# Patient Record
Sex: Female | Born: 2000 | Race: Black or African American | Hispanic: No | Marital: Single | Smoking: Former smoker
Health system: Southern US, Community
[De-identification: ages and names within clinical notes are randomized; demographics above are authoritative.]

## PROBLEM LIST (undated history)

## (undated) DIAGNOSIS — D649 Anemia, unspecified: Secondary | ICD-10-CM

## (undated) DIAGNOSIS — K08409 Partial loss of teeth, unspecified cause, unspecified class: Secondary | ICD-10-CM

## (undated) DIAGNOSIS — D509 Iron deficiency anemia, unspecified: Secondary | ICD-10-CM

## (undated) HISTORY — DX: Iron deficiency anemia, unspecified: D50.9

## (undated) HISTORY — PX: GALLBLADDER SURGERY: SHX652

## (undated) HISTORY — DX: Anemia, unspecified: D64.9

## (undated) HISTORY — DX: Partial loss of teeth, unspecified cause, unspecified class: K08.409

---
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
## 2019-04-25 ENCOUNTER — Emergency Department
Admission: EM | Admit: 2019-04-25 | Discharge: 2019-04-25 | Disposition: A | Discharge status: Home / Self Care | Payer: Self-pay | Attending: Emergency Medicine | Admitting: Emergency Medicine

## 2019-04-25 ENCOUNTER — Other Ambulatory Visit: Payer: Self-pay

## 2019-04-25 DIAGNOSIS — N6459 Other signs and symptoms in breast: Secondary | ICD-10-CM | POA: Insufficient documentation

## 2019-04-25 DIAGNOSIS — R21 Rash and other nonspecific skin eruption: Secondary | ICD-10-CM | POA: Insufficient documentation

## 2019-04-25 LAB — POCT PREGNANCY, URINE: Preg Test, Ur: NEGATIVE

## 2019-04-25 NOTE — ED Notes (Signed)
Pt states right nipple started bleeding today. This RN noted small pinpoint drop of blood on nipple.  Also pt is worried that she might be having reaction on skin on arms from baby powder.

## 2019-04-25 NOTE — Discharge Instructions (Signed)
Please call the [facility] in the morning for a follow-up appointment.  You will need to follow-up with them for further tests to evaluate your nipple bleeding.  Please return to the emergency department for any increase in bleeding.

## 2019-04-25 NOTE — ED Triage Notes (Signed)
Reports rash to bilateral arms and neck, states it is from baby powder. Small amount of blood from right nipple today, no injury and no pain. Pt alert and oriented X4, cooperative, RR even and unlabored, color WNL. Pt in NAD.

## 2019-04-25 NOTE — ED Provider Notes (Signed)
[facility] Emergency Department Provider Note  ____________________________________________  Time seen: Approximately 4:48 PM  I have reviewed the triage vital signs and the nursing notes.   HISTORY  Chief Complaint Rash and Nipple Problem    HPI Jacqueline Peters is a 18 y.o. female that presents to the emergency department for evaluation of a small amount of bloody drainage from right nipple this morning and rash to bilateral arms and chest for several days.  Patient noticed a small amount of bloody drainage to right brought this morning.  She placed a Band-Aid on nipple this morning and has not changed since.  Patient's breast does not hurt.  No purulent drainage.  No trauma.  No family history of breast cancer.  This has never happened before.  Last menstrual cycle was 9/14.  Rash started after she was using baby powder for a tick talk radio.  Rash seems to be spreading.  It does not itch.  She has not taken any medications for rash.  No new detergents, lotions, body washes, foods.  No fever.   History reviewed. No pertinent past medical history.  Patient Active Problem List   Diagnosis Date Noted  . CHILDHOOD OBESITY 04/23/2010  . EXCESSIVE MENSTRUAL BLEEDING 04/23/2010    History reviewed. No pertinent surgical history.  Prior to Admission medications   Medication Sig Start Date End Date Taking? Authorizing Provider  amoxicillin (AMOXIL) 250 MG capsule Take 3 capsules (750 mg total) by mouth 2 (two) times daily. 750mg  po bid x 10 days qs Patient not taking: Reported on 04/25/2019 08/20/12   Isaac Bliss, MD    Allergies Patient has no known allergies.  No family history on file.  Social History Social History   Tobacco Use  . Smoking status: Never Smoker  . Smokeless tobacco: Never Used  Substance Use Topics  . Alcohol use: Not Currently  . Drug use: Not Currently     Review of Systems  Constitutional: No fever/chills Cardiovascular: No  chest pain. Respiratory: No SOB. Gastrointestinal: No abdominal pain.  No nausea, no vomiting.  Musculoskeletal: Negative for musculoskeletal pain. Skin: Negative for abrasions, lacerations, ecchymosis.  Positive for rash. Neurological: Negative for headaches   ____________________________________________   PHYSICAL EXAM:  VITAL SIGNS: ED Triage Vitals  Enc Vitals Group     BP 04/25/19 1613 (!) 122/99     Pulse Rate 04/25/19 1613 81     Resp 04/25/19 1613 16     Temp 04/25/19 1613 98.4 F (36.9 C)     Temp Source 04/25/19 1613 Oral     SpO2 04/25/19 1613 100 %     Weight 04/25/19 1614 200 lb (90.7 kg)     Height 04/25/19 1614 5\' 5"  (1.651 m)     Head Circumference --      Peak Flow --      Pain Score 04/25/19 1613 0     Pain Loc --      Pain Edu? --      Excl. in [place]? --      Constitutional: Alert and oriented. Well appearing and in no acute distress. Eyes: Conjunctivae are normal. PERRL. EOMI. Head: Atraumatic. ENT:      Ears:      Nose: No congestion/rhinnorhea.      Mouth/Throat: Mucous membranes are moist.  Neck: No stridor.   Cardiovascular: Normal rate, regular rhythm.  Good peripheral circulation. Breast: Pin point drop of blood to bandage on right nipple.  No masses palpated.  No  active drainage from nipple. Respiratory: Normal respiratory effort without tachypnea or retractions. Lungs CTAB. Good air entry to the bases with no decreased or absent breath sounds Musculoskeletal: Full range of motion to all extremities. No gross deformities appreciated. Neurologic:  Normal speech and language. No gross focal neurologic deficits are appreciated.  Skin:  Skin is warm, dry and intact.  Few flesh colored papules to bilateral upper arms and chest. Psychiatric: Mood and affect are normal. Speech and behavior are normal. Patient exhibits appropriate insight and judgement.   ____________________________________________   LABS (all labs ordered are listed, but only  abnormal results are displayed)  Labs Reviewed  POC URINE PREG, ED  POCT PREGNANCY, URINE   ____________________________________________  EKG   ____________________________________________  RADIOLOGY   No results found.  ____________________________________________    PROCEDURES  Procedure(s) performed:    Procedures    Medications - No data to display   ____________________________________________   INITIAL IMPRESSION / ASSESSMENT AND PLAN / ED COURSE  Pertinent labs & imaging results that were available during my care of the patient were reviewed by me and considered in my medical decision making (see chart for details).  Review of the [place] CSRS was performed in accordance of the [place] prior to dispensing any controlled drugs.   Patient presents emergency department for evaluation of a small amount of bloody drainage to right nipple this morning and rash to arms and chest for several days.  Vital signs and exam are reassuring.  Breast exam is overall unremarkable.  No signs of infection.  Patient will follow-up with the [facility] breast center for further evaluation.  Patient was given prednisone for rash.  Patient will be discharged home with prescriptions for prednisone. Patient is to follow up with primary care and breast center as directed. Patient is given ED precautions to return to the ED for any worsening or new symptoms.  Jacqueline Peters was evaluated in Emergency Department on 04/25/2019 for the symptoms described in the history of present illness. She was evaluated in the context of the global COVID-19 pandemic, which necessitated consideration that the patient might be at risk for infection with the SARS-CoV-2 virus that causes COVID-19. Institutional protocols and algorithms that pertain to the evaluation of patients at risk for COVID-19 are in a state of rapid change based on information released by regulatory bodies including the CDC and federal and state  organizations. These policies and algorithms were followed during the patient's care in the ED.   ____________________________________________  FINAL CLINICAL IMPRESSION(S) / ED DIAGNOSES  Final diagnoses:  Rash  Bleeding from nipple in female      NEW MEDICATIONS STARTED DURING THIS VISIT:  ED Discharge Orders    None          This chart was dictated using voice recognition software/Dragon. Despite best efforts to proofread, errors can occur which can change the meaning. Any change was purely unintentional.    Laban Emperor, PA-C 04/25/19 1904    Duffy Bruce, MD 04/25/19 1911

## 2019-06-25 ENCOUNTER — Ambulatory Visit: Payer: Self-pay

## 2019-06-27 ENCOUNTER — Encounter: Payer: Self-pay | Admitting: *Deleted

## 2019-06-27 ENCOUNTER — Emergency Department: Payer: Self-pay

## 2019-06-27 ENCOUNTER — Emergency Department
Admission: EM | Admit: 2019-06-27 | Discharge: 2019-06-27 | Disposition: A | Discharge status: Home / Self Care | Payer: Self-pay | Attending: Emergency Medicine | Admitting: Emergency Medicine

## 2019-06-27 ENCOUNTER — Other Ambulatory Visit: Payer: Self-pay

## 2019-06-27 DIAGNOSIS — R040 Epistaxis: Secondary | ICD-10-CM | POA: Insufficient documentation

## 2019-06-27 DIAGNOSIS — D473 Essential (hemorrhagic) thrombocythemia: Secondary | ICD-10-CM | POA: Insufficient documentation

## 2019-06-27 DIAGNOSIS — D75839 Thrombocytosis, unspecified: Secondary | ICD-10-CM

## 2019-06-27 DIAGNOSIS — R0781 Pleurodynia: Secondary | ICD-10-CM | POA: Insufficient documentation

## 2019-06-27 DIAGNOSIS — D509 Iron deficiency anemia, unspecified: Secondary | ICD-10-CM | POA: Insufficient documentation

## 2019-06-27 LAB — COMPREHENSIVE METABOLIC PANEL
ALT: 14 U/L (ref 0–44)
AST: 13 U/L — ABNORMAL LOW (ref 15–41)
Albumin: 3.5 g/dL (ref 3.5–5.0)
Alkaline Phosphatase: 44 U/L (ref 38–126)
Anion gap: 11 (ref 5–15)
BUN: 12 mg/dL (ref 6–20)
CO2: 24 mmol/L (ref 22–32)
Calcium: 9.2 mg/dL (ref 8.9–10.3)
Chloride: 102 mmol/L (ref 98–111)
Creatinine, Ser: 0.69 mg/dL (ref 0.44–1.00)
GFR calc Af Amer: >60 mL/min (ref >60)
GFR calc non Af Amer: >60 mL/min (ref >60)
Glucose, Bld: 107 mg/dL — ABNORMAL HIGH (ref 70–99)
Potassium: 3.7 mmol/L (ref 3.5–5.1)
Sodium: 137 mmol/L (ref 135–145)
Total Bilirubin: 0.3 mg/dL (ref 0.3–1.2)
Total Protein: 8.2 g/dL — ABNORMAL HIGH (ref 6.5–8.1)

## 2019-06-27 LAB — CBC
HCT: 25.4 % — ABNORMAL LOW (ref 36.0–46.0)
Hemoglobin: 7.5 g/dL — ABNORMAL LOW (ref 12.0–15.0)
MCH: 19.6 pg — ABNORMAL LOW (ref 26.0–34.0)
MCHC: 29.5 g/dL — ABNORMAL LOW (ref 30.0–36.0)
MCV: 66.3 fL — ABNORMAL LOW (ref 80.0–100.0)
Platelets: 979 10*3/uL (ref 150–400)
RBC: 3.83 MIL/uL — ABNORMAL LOW (ref 3.87–5.11)
RDW: 18.5 % — ABNORMAL HIGH (ref 11.5–15.5)
WBC: 11.9 10*3/uL — ABNORMAL HIGH (ref 4.0–10.5)
nRBC: 0 % (ref 0.0–0.2)

## 2019-06-27 LAB — URINALYSIS, COMPLETE (UACMP) WITH MICROSCOPIC
Bacteria, UA: NONE SEEN
Bilirubin Urine: NEGATIVE
Glucose, UA: NEGATIVE mg/dL
Hgb urine dipstick: NEGATIVE
Ketones, ur: NEGATIVE mg/dL
Nitrite: NEGATIVE
Protein, ur: NEGATIVE mg/dL
Specific Gravity, Urine: 1.029 (ref 1.005–1.030)
pH: 6 (ref 5.0–8.0)

## 2019-06-27 LAB — IRON AND TIBC
Iron: 10 ug/dL — ABNORMAL LOW (ref 28–170)
Saturation Ratios: 3 % — ABNORMAL LOW (ref 10.4–31.8)
TIBC: 372 ug/dL (ref 250–450)
UIBC: 362 ug/dL

## 2019-06-27 LAB — POCT PREGNANCY, URINE: Preg Test, Ur: NEGATIVE

## 2019-06-27 LAB — LIPASE, BLOOD: Lipase: 23 U/L (ref 11–51)

## 2019-06-27 LAB — FERRITIN: Ferritin: 5 ng/mL — ABNORMAL LOW (ref 11–307)

## 2019-06-27 MED ORDER — FERROUS SULFATE 325 (65 FE) MG PO TABS: 325.0000 mg | ORAL_TABLET | Freq: Every day | ORAL | 0 refills | Status: DC

## 2019-06-27 MED ORDER — SODIUM CHLORIDE 0.9% FLUSH: 3.0000 mL | Freq: Once | INTRAVENOUS | Status: DC

## 2019-06-27 MED ORDER — IBUPROFEN 600 MG PO TABS: 600.0000 mg | ORAL_TABLET | Freq: Four times a day (QID) | ORAL | 0 refills | Status: DC | PRN

## 2019-06-27 MED ORDER — IBUPROFEN 600 MG PO TABS
600.0000 mg | ORAL_TABLET | Freq: Once | ORAL | Status: AC
  Administered 2019-06-27: 600 mg via ORAL
  Filled 2019-06-27: qty 1

## 2019-06-27 NOTE — ED Notes (Signed)
poct pregnancy Negative 

## 2019-06-27 NOTE — ED Notes (Signed)
Lab notified to add on Iron and Ferritin levels.

## 2019-06-27 NOTE — Discharge Instructions (Addendum)
Dr. Kem Parkinson office has been given your information and should call you tomorrow morning.  If you do not hear from her by tomorrow afternoon, you should call to make an appointment as soon as possible.  Take the iron pill as prescribed until you follow-up with Dr. Mike Gip.  Return to the ER for any new or worsening pain, difficulty breathing, abnormal bleeding or bruising, weakness, or any other new or worsening symptoms that concern you.

## 2019-06-27 NOTE — ED Notes (Signed)
Patient transported to X-ray 

## 2019-06-27 NOTE — ED Provider Notes (Signed)
[facility] Emergency Department Provider Note ____________________________________________   First MD Initiated Contact with Patient 06/27/19 1618     (approximate)  I have reviewed the triage vital signs and the nursing notes.   HISTORY  Chief Complaint Abdominal Pain and Epistaxis    HPI Jacqueline Peters is a 18 y.o. female with PMH as noted below and no active medical problems who presents with right flank/lower rib area pain, acute onset since yesterday, and not precipitated by any trauma.  She states it is worse with certain positions.  She has no associated shortness of breath, nausea or vomiting, abdominal pain, or fever.  She reports mild discomfort when she urinates.  She denies any vaginal bleeding or discharge.  She does report that she just ended her period, and her periods are usually heavy.  She had a nosebleed earlier that resolved.  She denies any weakness or fatigue, or any abnormal bleeding or bruising.  No past medical history on file.  Patient Active Problem List   Diagnosis Date Noted  . CHILDHOOD OBESITY 04/23/2010  . EXCESSIVE MENSTRUAL BLEEDING 04/23/2010    No past surgical history on file.  Prior to Admission medications   Medication Sig Start Date End Date Taking? Authorizing Provider  amoxicillin (AMOXIL) 250 MG capsule Take 3 capsules (750 mg total) by mouth 2 (two) times daily. 750mg  po bid x 10 days qs Patient not taking: Reported on 04/25/2019 08/20/12   Isaac Bliss, MD  ferrous sulfate 325 (65 FE) MG tablet Take 1 tablet (325 mg total) by mouth daily. 06/27/19 07/27/19  Arta Silence, MD  ibuprofen (ADVIL) 600 MG tablet Take 1 tablet (600 mg total) by mouth every 6 (six) hours as needed. 06/27/19   Arta Silence, MD    Allergies Patient has no known allergies.  No family history on file.  Social History Social History   Tobacco Use  . Smoking status: Never Smoker  . Smokeless tobacco: Never Used   Substance Use Topics  . Alcohol use: Not Currently  . Drug use: Not Currently    Review of Systems  Constitutional: No fever/chills.  No fatigue. Eyes: No visual changes. ENT: No sore throat. Cardiovascular: Denies chest pain. Respiratory: Denies shortness of breath. Gastrointestinal: No nausea, no vomiting.  No diarrhea.  Genitourinary: Negative for vaginal bleeding. Musculoskeletal: Negative for back pain. Skin: Negative for rash. Neurological: Negative for headaches, focal weakness or numbness.   ____________________________________________   PHYSICAL EXAM:  VITAL SIGNS: ED Triage Vitals  Enc Vitals Group     BP 06/27/19 1523 122/69     Pulse Rate 06/27/19 1523 86     Resp 06/27/19 1523 20     Temp 06/27/19 1523 98.6 F (37 C)     Temp Source 06/27/19 1523 Oral     SpO2 06/27/19 1523 100 %     Weight 06/27/19 1524 190 lb (86.2 kg)     Height 06/27/19 1524 5\' 5"  (1.651 m)     Head Circumference --      Peak Flow --      Pain Score 06/27/19 1523 10     Pain Loc --      Pain Edu? --      Excl. in [place]? --     Constitutional: Alert and oriented. Well appearing and in no acute distress. Eyes: Conjunctivae are normal.  Head: Atraumatic. Nose: No congestion/rhinnorhea. Mouth/Throat: Mucous membranes are moist.   Neck: Normal range of motion.  Cardiovascular: Normal rate, regular  rhythm.  Good peripheral circulation. Respiratory: Normal respiratory effort.  No retractions.  Very mild right lateral inferior rib area tenderness.  No step-off or crepitus. Gastrointestinal: Soft and nontender. No distention.  Genitourinary: No flank or CVA tenderness. Musculoskeletal: No lower extremity edema.  Extremities warm and well perfused.  Neurologic:  Normal speech and language. No gross focal neurologic deficits are appreciated.  Skin:  Skin is warm and dry. No rash noted. Psychiatric: Mood and affect are normal. Speech and behavior are normal.   ____________________________________________   LABS (all labs ordered are listed, but only abnormal results are displayed)  Labs Reviewed  COMPREHENSIVE METABOLIC PANEL - Abnormal; Notable for the following components:      Result Value   Glucose, Bld 107 (*)    Total Protein 8.2 (*)    AST 13 (*)    All other components within normal limits  CBC - Abnormal; Notable for the following components:   WBC 11.9 (*)    RBC 3.83 (*)    Hemoglobin 7.5 (*)    HCT 25.4 (*)    MCV 66.3 (*)    MCH 19.6 (*)    MCHC 29.5 (*)    RDW 18.5 (*)    Platelets 979 (*)    All other components within normal limits  URINALYSIS, COMPLETE (UACMP) WITH MICROSCOPIC - Abnormal; Notable for the following components:   Color, Urine YELLOW (*)    APPearance HAZY (*)    Leukocytes,Ua SMALL (*)    All other components within normal limits  FERRITIN - Abnormal; Notable for the following components:   Ferritin 5 (*)    All other components within normal limits  IRON AND TIBC - Abnormal; Notable for the following components:   Iron 10 (*)    Saturation Ratios 3 (*)    All other components within normal limits  LIPASE, BLOOD  PATHOLOGIST SMEAR REVIEW  POC URINE PREG, ED  POCT PREGNANCY, URINE   ____________________________________________  EKG   ____________________________________________  RADIOLOGY  XR R ribs and chest: No acute abnormality  ____________________________________________   PROCEDURES  Procedure(s) performed: No  Procedures  Critical Care performed: No ____________________________________________   INITIAL IMPRESSION / ASSESSMENT AND PLAN / ED COURSE  Pertinent labs & imaging results that were available during my care of the patient were reviewed by me and considered in my medical decision making (see chart for details).  18 year old female with no known active medical problems presents with right lateral rib/flank pain since yesterday.  She has no significant  associated symptoms.  She did have a nosebleed today that resolved on its own.  She has had no other abnormal bleeding or bruising and no fatigue.  On exam, the patient is very well-appearing.  Her vital signs are normal.  The physical exam is unremarkable except for mild discomfort when I palpate the right lateral lower rib area.  There is no abdominal or CVA tenderness.  Overall presentation is most consistent with musculoskeletal pain.  There is no evidence of cholecystitis, other hepatobiliary cause, or other intra-abdominal source.  The patient has some mild discomfort when she urinates but no symptoms of UTI or pyelonephritis.  She has no respiratory symptoms or tachycardia.  There is no evidence of PE or other pulmonary etiology.  Of note, initial labs reveal significant iron deficiency anemia and thrombocytosis.  The patient endorses heavy periods chronically, and I suspect that she has been somewhat anemic chronically.  She has no symptoms of anemia or abnormal vital  signs and there is no indication for emergent transfusion.  However, besides iron deficiency, the differential for the thrombocytosis includes mild dysplastic syndromes or leukemia.  I consulted Dr. Mike Gip from hematology.  She recommends if the patient has no indication for emergent transfusion, that we start the patient on iron supplementation and have her follow-up in the hematology office this week.  In the meantime we will obtain x-rays of the right ribs and chest.  ----------------------------------------- 6:11 PM on 06/27/2019 -----------------------------------------  X-rays show no acute abnormality.  The patient continues to appear comfortable.  She is stable for discharge home.  She will follow up with Dr. Mike Gip tomorrow.  Dr. Mike Gip has been provided with the patient's information.  I gave the patient return precautions and she expressed understanding.  I prescribed ferrous sulfate.   ____________________________________________   FINAL CLINICAL IMPRESSION(S) / ED DIAGNOSES  Final diagnoses:  Rib pain on right side  Iron deficiency anemia, unspecified iron deficiency anemia type  Thrombocytosis (HCC)      NEW MEDICATIONS STARTED DURING THIS VISIT:  New Prescriptions   FERROUS SULFATE 325 (65 FE) MG TABLET    Take 1 tablet (325 mg total) by mouth daily.   IBUPROFEN (ADVIL) 600 MG TABLET    Take 1 tablet (600 mg total) by mouth every 6 (six) hours as needed.     Note:  This document was prepared using Dragon voice recognition software and may include unintentional dictation errors.   Arta Silence, MD 06/27/19 [phone]

## 2019-06-27 NOTE — ED Triage Notes (Signed)
Pt has right side abd pain.  No n/v/d.  Pt also has right side nose bleed.  Bleeding stopped in triage.  No known injury to nose.  Pt alert  Speech clear.

## 2019-06-28 LAB — PATHOLOGIST SMEAR REVIEW

## 2019-06-30 NOTE — Progress Notes (Signed)
[facility]  [address] [place], [place] [address] Phone: [phone]  Fax: [phone]   Clinic Day:  07/01/2019  Referring physician: Arta Silence, MD  Chief Complaint: Jacqueline Peters is a 18 y.o. female with iron deficiency anemia and thrombocytosis who is referred in consultation by Dr. Arta Peters for assessment and management.   HPI:  The patient tested positive for COVID-19 on 01/29/2019.   The patient was seen on 05/03/2019 with bloody right nipple discharge x 2 days. Right breast ultrasound on 05/03/2019 revealed right retroareolar anterior depth corresponding to the site of blood noted on the patient's nipple (1-2 o'clock) was negative. There was no ductal dilation. There was no intraductal mass. There was no suspicious mass or associated feature. Clinical follow up was recommended.   She was seen in the [facility] ER on 06/27/2019 by Dr. Cherylann Peters for right sided chest pain and epistaxis. She noted right flank and lower rib pain.  Pain increased with position change. Pain had a pleuritic component.  She reported mild discomfort when she urinated. She had just ended her period. She had a nosebleed earlier that resolved.  She denied any shortness of breath, nausea or vomiting, abdominal pain, or fever. She denied any vaginal bleeding or discharge. She denied any weakness or fatigue, or any abnormal bleeding or bruising.  Right ribs and chest x-ray on 06/27/2019 was normal.   Labs on 06/27/2019 revealed a hematocrit 25.4, hemoglobin 7.5, MCV 66.3, platelets 979,000, and WBC 11,900. Ferritin was 5 with and iron saturation of 3% with a TIBC of 372. Total protein was 8.2.  Lipase was 23. Pregnancy test was negative.  Urinalysis revealed a small amount of leukocytes.   Peripheral smear revealed a microcytic hypochromic iron deficiency anemia. There was mild leukocytosis with unremarkable WBC morphology. There was thrombocytosis with unremarkable  platelet morphology. Findings consistent with chronic iron deficiency and secondary reactive thrombocytosis.   Last available CBC on 04/23/2010 revealed a hematocrit of 33.8, hemoglobin 11.4, MCV 83.1, platelets 394,000, WBC 5900 with an ANC of 2200.  Symptomatically, she feels "good".  She describes right rib pain for 1 week. Pain increases with deep breathing, coughing, and any positional changes.  She denies any trauma.  She denies any lower extremity swelling.  She denies any fever.  She has a history of nosebleeds since she was younger; her last nosebleed was 1 day ago.  Patient reports intermittent hematuria. She denies any melena or hematochezia.  Bowels are normal. She denies any B symptoms.  She denies any dizzy or lightheadedness.   Her diet is described as poor. She eats a lot of fast food. Patient eats meat regularly. She does not eat vegetables. She denies any ice pica. She has no restless legs. The patient has been on oral iron once a day since 06/27/2019.  She takes oral iron with water.   Menses is described as heavy.  With each cycle, she goes through 16 pads.  Her previous menstrual cycle was 06/07/2019 - 06/11/2019.  Her cycle is every 4 weeks. She is not on any birth control.  Menstrual cramps are mild; she only takes ibuprofen on the first day. She denies easy bruising or bleeding.  She has never had surgery or a dental extraction.   Past Medical History:  Diagnosis Date  . Anemia   She denies any family history of any blood disorders.  Her maternal grandmother has thyroid issues and was anemic. Cancer runs in her family. Her maternal  great aunt died from breast cancer. Her paternal great grandmother passed from breast cancer. Her maternal grandfather had leukemia.   History reviewed. No pertinent surgical history.  History reviewed. No pertinent family history.  Social History:  reports that she has never smoked. She has never used smokeless tobacco. She reports previous  alcohol use. She reports previous drug use. The patient smokes black cigars twice a day for the past 3 months.  She lives at home with her mother.  Her mother works 2nd shift.  She does not work.  She is not in school.  She lives in [place].  The patient is accompanied by her mother, Jacqueline Peters, via face time today.  Allergies: No Known Allergies  Current Medications: Current Outpatient Medications  Medication Sig Dispense Refill  . ferrous sulfate 325 (65 FE) MG tablet Take 1 tablet (325 mg total) by mouth daily. 30 tablet 0  . amoxicillin (AMOXIL) 250 MG capsule Take 3 capsules (750 mg total) by mouth 2 (two) times daily. 750mg  po bid x 10 days qs (Patient not taking: Reported on 04/25/2019) 60 capsule 0  . ibuprofen (ADVIL) 600 MG tablet Take 1 tablet (600 mg total) by mouth every 6 (six) hours as needed. (Patient not taking: Reported on 07/01/2019) 20 tablet 0   No current facility-administered medications for this visit.     Review of Systems  Constitutional: Negative.  Negative for chills, diaphoresis, fever, malaise/fatigue and weight loss.       Feels "good".  Energy level is good.  HENT: Positive for nosebleeds. Negative for congestion, ear discharge, ear pain, hearing loss, sinus pain and sore throat.   Eyes: Negative.  Negative for blurred vision, double vision, photophobia and pain.  Respiratory: Positive for cough. Negative for hemoptysis, sputum production, shortness of breath and wheezing.   Cardiovascular: Positive for chest pain (right lower lateral chest pain; pleuritic in nature; pain increased with position change). Negative for palpitations, orthopnea, leg swelling and PND.  Gastrointestinal: Negative for abdominal pain, blood in stool, constipation, diarrhea, heartburn, melena, nausea and vomiting.       Normal bowels. Poor diet.  No ice pica.  Genitourinary: Positive for hematuria (intermittent). Negative for dysuria, frequency and urgency.       Heavy menses (use 16  pads per cycle).  Musculoskeletal: Negative.  Negative for back pain, joint pain and myalgias.  Skin: Negative.  Negative for itching and rash.  Neurological: Negative.  Negative for dizziness, tingling, sensory change, speech change, focal weakness, weakness and headaches.  Endo/Heme/Allergies: Negative.  Does not bruise/bleed easily.  Psychiatric/Behavioral: Negative.  Negative for depression and memory loss. The patient is not nervous/anxious and does not have insomnia.   All other systems reviewed and are negative.  Performance status (ECOG): 1  Vitals Blood pressure 122/71, pulse 77, temperature 97.9 F (36.6 C), temperature source Tympanic, resp. rate 18, height 5\' 5"  (1.651 m), weight 190 lb 4.1 oz (86.3 kg), last menstrual period 06/11/2019, SpO2 100 %.  Physical Exam  Constitutional: She is oriented to person, place, and time. She appears well-developed and well-nourished. No distress.  HENT:  Head: Normocephalic and atraumatic.  Mouth/Throat: Oropharynx is clear and moist. No oropharyngeal exudate.  Black night cap.  Mask.  Eyes: Pupils are equal, round, and reactive to light. Conjunctivae and EOM are normal. No scleral icterus.  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 Brown eyes.  Neck: Normal range of motion. Neck supple. No JVD present.  Cardiovascular: Normal rate, regular rhythm  and normal heart sounds.  No murmur heard. Pulmonary/Chest: Effort normal and breath sounds normal. No respiratory distress. She has no wheezes. She has no rales. She exhibits no tenderness.  Right lower lateral rib pain on palpation (pinpoint).  Abdominal: Soft. Bowel sounds are normal. She exhibits no distension and no mass. There is no abdominal tenderness. There is no rebound and no guarding.  Musculoskeletal: Normal range of motion.        General: No tenderness or edema.     Comments: Negative Homan's sign.  Lymphadenopathy:       Head (right side): No preauricular,  no posterior auricular and no occipital adenopathy present.       Head (left side): No preauricular, no posterior auricular and no occipital adenopathy present.    She has no cervical adenopathy.    She has no axillary adenopathy.       Right: No inguinal and no supraclavicular adenopathy present.       Left: No inguinal and no supraclavicular adenopathy present.  Neurological: She is alert and oriented to person, place, and time.  Skin: Skin is warm and dry. No rash noted. She is not diaphoretic. No erythema. No pallor.  Psychiatric: She has a normal mood and affect. Her behavior is normal. Judgment and thought content normal.  Nursing note and vitals reviewed.   No visits with results within 3 Day(s) from this visit.  Latest known visit with results is:  Admission on 06/27/2019, Discharged on 06/27/2019  Component Date Value Ref Range Status  . Lipase 06/27/2019 23  11 - 51 U/L Final   Performed at [facility], [address] [place], [place] [address]  . Sodium 06/27/2019 137  135 - 145 mmol/L Final  . Potassium 06/27/2019 3.7  3.5 - 5.1 mmol/L Final  . Chloride 06/27/2019 102  98 - 111 mmol/L Final  . CO2 06/27/2019 24  22 - 32 mmol/L Final  . Glucose, Bld 06/27/2019 107* 70 - 99 mg/dL Final  . BUN 06/27/2019 12  6 - 20 mg/dL Final  . Creatinine, Ser 06/27/2019 0.69  0.44 - 1.00 mg/dL Final  . Calcium 06/27/2019 9.2  8.9 - 10.3 mg/dL Final  . Total Protein 06/27/2019 8.2* 6.5 - 8.1 g/dL Final  . Albumin 06/27/2019 3.5  3.5 - 5.0 g/dL Final  . AST 06/27/2019 13* 15 - 41 U/L Final  . ALT 06/27/2019 14  0 - 44 U/L Final  . Alkaline Phosphatase 06/27/2019 44  38 - 126 U/L Final  . Total Bilirubin 06/27/2019 0.3  0.3 - 1.2 mg/dL Final  . GFR calc non Af Amer 06/27/2019 >60  >60 mL/min Final  . GFR calc Af Amer 06/27/2019 >60  >60 mL/min Final  . Anion gap 06/27/2019 11  5 - 15 Final   Performed at [facility], [address], [place], [place] [address]  . WBC  06/27/2019 11.9* 4.0 - 10.5 K/uL Final  . RBC 06/27/2019 3.83* 3.87 - 5.11 MIL/uL Final  . Hemoglobin 06/27/2019 7.5* 12.0 - 15.0 g/dL Final   Comment: Reticulocyte Hemoglobin testing may be clinically indicated, consider ordering this additional test [id number]   . HCT 06/27/2019 25.4* 36.0 - 46.0 % Final  . MCV 06/27/2019 66.3* 80.0 - 100.0 fL Final  . MCH 06/27/2019 19.6* 26.0 - 34.0 pg Final  . MCHC 06/27/2019 29.5* 30.0 - 36.0 g/dL Final  . RDW 06/27/2019 18.5* 11.5 - 15.5 % Final  . Platelets 06/27/2019 979* 150 - 400  K/uL Final   This critical result has verified and been called to [facility] by [facility] on 11 19 2020 at 1633, and has been read back.   Marland Kitchen nRBC 06/27/2019 0.0  0.0 - 0.2 % Final   Performed at [facility], [address] [place], [place] [address]  . Color, Urine 06/27/2019 YELLOW* YELLOW Final  . APPearance 06/27/2019 HAZY* CLEAR Final  . Specific Gravity, Urine 06/27/2019 1.029  1.005 - 1.030 Final  . pH 06/27/2019 6.0  5.0 - 8.0 Final  . Glucose, UA 06/27/2019 NEGATIVE  NEGATIVE mg/dL Final  . Hgb urine dipstick 06/27/2019 NEGATIVE  NEGATIVE Final  . Bilirubin Urine 06/27/2019 NEGATIVE  NEGATIVE Final  . Ketones, ur 06/27/2019 NEGATIVE  NEGATIVE mg/dL Final  . Protein, ur 06/27/2019 NEGATIVE  NEGATIVE mg/dL Final  . Nitrite 06/27/2019 NEGATIVE  NEGATIVE Final  . Chalmers Guest 06/27/2019 SMALL* NEGATIVE Final  . RBC / HPF 06/27/2019 0-5  0 - 5 RBC/hpf Final  . WBC, UA 06/27/2019 0-5  0 - 5 WBC/hpf Final  . Bacteria, UA 06/27/2019 NONE SEEN  NONE SEEN Final  . Squamous Epithelial / LPF 06/27/2019 0-5  0 - 5 Final  . Mucus 06/27/2019 PRESENT   Final   Performed at [facility], [address] [place], [place] [address]  . Preg Test, Ur 06/27/2019 NEGATIVE  NEGATIVE Final   Comment:        THE SENSITIVITY OF THIS METHODOLOGY IS >24 mIU/mL   . Path Review 06/27/2019 Blood smear is reviewed.   Final   Comment: Patient  presented to ED with right side rib pain. Microcytic hypochromic iron deficiency anemia Mild leukocytosis with unremarkable WBC morphology. Thrombocytosis with unremarkable platelet morphology. Findings consistent with chronic iron deficiency and secondary reactive thrombocytosis. Reviewed by Dellia Nims Reuel Derby, M.D. Performed at [facility], [address], [place], [place] [address]   . Ferritin 06/27/2019 5* 11 - 307 ng/mL Final   Performed at [facility], [address] [place], [place] [address]  . Iron 06/27/2019 10* 28 - 170 ug/dL Final  . TIBC 06/27/2019 372  250 - 450 ug/dL Final  . Saturation Ratios 06/27/2019 3* 10.4 - 31.8 % Final  . UIBC 06/27/2019 362  ug/dL Final   Performed at [facility], [address], [place], [place] [address]    Assessment:  Jakyrah Myklebust is a 19 y.o. 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  Diet is poor.  She has menorrhagia.  She notes intermittent hematuria.  She began ferrous sulfate 1 tablet/day on 06/27/2019.  She has a 1 week history of right lower lateral chest pain/rib pain without trauma.  Pain is somewhat pleuritic in nature and is worse with position changes.  She denies any shortness of breath or hemoptysis.  She denies any trauma. Oxygen saturation is 100%.  CXR and rib films on 06/27/2019 were normal.  Labs on 06/27/2019 revealed a hematocrit 25.4, hemoglobin 7.5, MCV 66.3, platelets 979,000, WBC 11,900.  Ferritin was 5 with and iron saturation of 3% with a TIBC of 372.  Total protein was 8.2.  Lipase was 23.   Peripheral smear revealed a microcytic hypochromic iron deficiency anemia. There was mild leukocytosis with unremarkable WBC morphology. There was thrombocytosis with unremarkable platelet morphology. Findings consistent with chronic iron deficiency and secondary reactive thrombocytosis.  She has a 2 day history of bloody right nipple discharge.  Right breast ultrasound  on 05/03/2019 revealed right retroareolar  anterior depth corresponding to the site of blood noted on the patient's nipple (1-2 o'clock) was negative. There was no ductal dilation. There was no intraductal mass. There was no suspicious mass or associated feature. Clinical follow up was recommended.   Symptomatically, she denies any shortness of breath.  She denies any melena or hematochezia.  Menses is heavy.  Plan: 1.   Labs today:  CBC with diff, retic, PT, PTT, von Willebrand panel, D-dimer, hold tube. 2.   Iron deficiency anemia  Hematocrit 25.4.  Hemoglobin 7.5.  MCV 66.3 on 06/27/2019.   Ferritin 5 with an iron saturation of 3% and a TIBC of 372.  Patient notes menorrhagia and intermittent hematuria.  Diet is poor.   Discuss increase in iron rich foods.  Increase oral iron to BID and possibly TID if tolerated.   Take oral iron with OJ or vitamin C to increase absorption.  If oral iron not tolerated, discuss consideration of IV iron.   Preauth Venofer. 3.   Thrombocytosis  Platelet count 979.000 on 06/27/2019.  Etiology is felt reactive and due to severe iron deficiency anemia.  Follow platelet count with treatment of iron deficiency anemia.  If platelet count does not normalize, discuss plan to r/o myeloproliferative disorder. 4.   Right sided rib/chest pain  Etiology unclear.  Patient denies trauma.  Patient denies any lower extremity edema   Pain appears to localize to a lower lateral rib.  Patient has a history of COVID-19.  Discuss checking D-dimer to r/o PE. 5.   Menorrhagia  Patient notes minimal use of ibuprofen and no use of aspirin.  Bleeding diathesis evaluation:  PT, PTT, von Willebrand panel. 6.   RTC in 1 week for MD assessment, labs (CBC, retic), and +/- Venofer.  Addendum:  D-dimer was 7401.92 (0-499).  Patient contacted at home.  Results reviewed and directed to the [facility] ER.  The charge nurse at [facility] was contacted.  I discussed the assessment and treatment plan  with the patient.  The patient was provided an opportunity to ask questions and all were answered.  The patient agreed with the plan and demonstrated an understanding of the instructions.  The patient was advised to call back if the symptoms worsen or if the condition fails to improve as anticipated.  I provided 45 minutes of face-to-face time during this this encounter and > 50% was spent counseling as documented under my assessment and plan.    Melissa C. Mike Gip, MD, PhD    07/01/2019, 10:52 AM  I, Selena Batten, am acting as scribe for [place]. Mike Gip, MD, PhD.  I, Melissa C. Mike Gip, MD, have reviewed the above documentation for accuracy and completeness, and I agree with the above.

## 2019-07-01 ENCOUNTER — Telehealth: Payer: Self-pay | Admitting: *Deleted

## 2019-07-01 ENCOUNTER — Inpatient Hospital Stay: Payer: Self-pay | Attending: Hematology and Oncology | Admitting: Hematology and Oncology

## 2019-07-01 ENCOUNTER — Telehealth: Payer: Self-pay | Admitting: Hematology and Oncology

## 2019-07-01 ENCOUNTER — Encounter: Payer: Self-pay | Admitting: Hematology and Oncology

## 2019-07-01 ENCOUNTER — Telehealth: Payer: Self-pay

## 2019-07-01 ENCOUNTER — Inpatient Hospital Stay: Payer: Self-pay

## 2019-07-01 ENCOUNTER — Other Ambulatory Visit: Payer: Self-pay

## 2019-07-01 ENCOUNTER — Encounter: Payer: Self-pay | Admitting: *Deleted

## 2019-07-01 ENCOUNTER — Emergency Department: Payer: Self-pay

## 2019-07-01 ENCOUNTER — Emergency Department
Admission: EM | Admit: 2019-07-01 | Discharge: 2019-07-01 | Disposition: A | Discharge status: Home / Self Care | Payer: Self-pay | Attending: Emergency Medicine | Admitting: Emergency Medicine

## 2019-07-01 VITALS — BP 122/71 | HR 77 | Temp 97.9°F | Resp 18 | Ht 65.0 in | Wt 190.3 lb

## 2019-07-01 DIAGNOSIS — R0781 Pleurodynia: Secondary | ICD-10-CM | POA: Insufficient documentation

## 2019-07-01 DIAGNOSIS — E041 Nontoxic single thyroid nodule: Secondary | ICD-10-CM | POA: Insufficient documentation

## 2019-07-01 DIAGNOSIS — Z79899 Other long term (current) drug therapy: Secondary | ICD-10-CM | POA: Insufficient documentation

## 2019-07-01 DIAGNOSIS — R319 Hematuria, unspecified: Secondary | ICD-10-CM | POA: Insufficient documentation

## 2019-07-01 DIAGNOSIS — N920 Excessive and frequent menstruation with regular cycle: Secondary | ICD-10-CM | POA: Insufficient documentation

## 2019-07-01 DIAGNOSIS — D75839 Thrombocytosis, unspecified: Secondary | ICD-10-CM

## 2019-07-01 DIAGNOSIS — D500 Iron deficiency anemia secondary to blood loss (chronic): Secondary | ICD-10-CM | POA: Insufficient documentation

## 2019-07-01 DIAGNOSIS — D509 Iron deficiency anemia, unspecified: Secondary | ICD-10-CM

## 2019-07-01 DIAGNOSIS — R7989 Other specified abnormal findings of blood chemistry: Secondary | ICD-10-CM

## 2019-07-01 DIAGNOSIS — R0789 Other chest pain: Secondary | ICD-10-CM | POA: Insufficient documentation

## 2019-07-01 DIAGNOSIS — D473 Essential (hemorrhagic) thrombocythemia: Secondary | ICD-10-CM | POA: Insufficient documentation

## 2019-07-01 DIAGNOSIS — D649 Anemia, unspecified: Secondary | ICD-10-CM | POA: Insufficient documentation

## 2019-07-01 DIAGNOSIS — R791 Abnormal coagulation profile: Secondary | ICD-10-CM | POA: Insufficient documentation

## 2019-07-01 LAB — CBC WITH DIFFERENTIAL/PLATELET
Abs Immature Granulocytes: 0.06 10*3/uL (ref 0.00–0.07)
Basophils Absolute: 0.1 10*3/uL (ref 0.0–0.1)
Basophils Relative: 1 %
Eosinophils Absolute: 0.2 10*3/uL (ref 0.0–0.5)
Eosinophils Relative: 2 %
HCT: 27.4 % — ABNORMAL LOW (ref 36.0–46.0)
Hemoglobin: 8 g/dL — ABNORMAL LOW (ref 12.0–15.0)
Immature Granulocytes: 1 %
Lymphocytes Relative: 18 %
Lymphs Abs: 2 10*3/uL (ref 0.7–4.0)
MCH: 20 pg — ABNORMAL LOW (ref 26.0–34.0)
MCHC: 29.2 g/dL — ABNORMAL LOW (ref 30.0–36.0)
MCV: 68.3 fL — ABNORMAL LOW (ref 80.0–100.0)
Monocytes Absolute: 0.5 10*3/uL (ref 0.1–1.0)
Monocytes Relative: 4 %
Neutro Abs: 8 10*3/uL — ABNORMAL HIGH (ref 1.7–7.7)
Neutrophils Relative %: 74 %
Platelets: 929 10*3/uL (ref 150–400)
RBC: 4.01 MIL/uL (ref 3.87–5.11)
RDW: 19.3 % — ABNORMAL HIGH (ref 11.5–15.5)
WBC: 10.8 10*3/uL — ABNORMAL HIGH (ref 4.0–10.5)
nRBC: 0 % (ref 0.0–0.2)

## 2019-07-01 LAB — URINALYSIS, COMPLETE (UACMP) WITH MICROSCOPIC
Glucose, UA: NEGATIVE mg/dL
Ketones, ur: NEGATIVE mg/dL
Leukocytes,Ua: NEGATIVE
Nitrite: NEGATIVE
Specific Gravity, Urine: >1.03 — ABNORMAL HIGH (ref 1.005–1.030)
pH: 6.5 (ref 5.0–8.0)

## 2019-07-01 LAB — RETICULOCYTES
Immature Retic Fract: 38.7 % — ABNORMAL HIGH (ref 2.3–15.9)
RBC.: 4.03 MIL/uL (ref 3.87–5.11)
Retic Count, Absolute: 75 10*3/uL (ref 19.0–186.0)
Retic Ct Pct: 1.9 % (ref 0.4–3.1)

## 2019-07-01 LAB — APTT: aPTT: 35 seconds (ref 24–36)

## 2019-07-01 LAB — PROTIME-INR
INR: 1 (ref 0.8–1.2)
Prothrombin Time: 13.4 seconds (ref 11.4–15.2)

## 2019-07-01 LAB — FIBRIN DERIVATIVES D-DIMER (ARMC ONLY): Fibrin derivatives D-dimer (ARMC): 7401.92 ng/mL (FEU) — ABNORMAL HIGH (ref 0.00–499.00)

## 2019-07-01 LAB — SAMPLE TO BLOOD BANK

## 2019-07-01 MED ORDER — IOHEXOL 350 MG/ML SOLN
75.0000 mL | Freq: Once | INTRAVENOUS | Status: AC | PRN
  Administered 2019-07-01: 23:00:00 75 mL via INTRAVENOUS

## 2019-07-01 NOTE — Telephone Encounter (Signed)
Spoke with the patient to inform her that she has slightly improved on her oral Iron 4 days ago 7.5 and today with labs 8.0. The patient was sunderstandinmag and agreeable.

## 2019-07-01 NOTE — Discharge Instructions (Signed)
As we discussed, your CT scan was normal and does not show any sign of blood clots in your lungs.  An incidental finding was that of a small nodule on your thyroid.  The radiologist did not recommend any follow-up as these are most often normal and do not indicate any concerning abnormality.  I mention it only so that you can pass along this information to your hematologist in case she wants to further evaluated as part of your ongoing work-up.  Please follow-up with Dr. Mike Gip

## 2019-07-01 NOTE — Telephone Encounter (Signed)
-----   Message from Lequita Asal, MD sent at 07/01/2019  1:03 PM EST ----- Regarding: Please call patient  Anemia has improved slightly on oral iron.  M ----- Message ----- From: Buel Ream, Lab In [place] Sent: 07/01/2019  12:47 PM EST To: Lequita Asal, MD

## 2019-07-01 NOTE — ED Provider Notes (Signed)
[facility] Emergency Department Provider Note  ____________________________________________   First MD Initiated Contact with Patient 07/01/19 2256     (approximate)  I have reviewed the triage vital signs and the nursing notes.   HISTORY  Chief Complaint Abnormal Labs   HPI Jacqueline Peters is a 18 y.o. female with medical conditions as listed below who presents tonight for evaluation for possible pulmonary embolism.  She was seen in this ED about 4 days ago for some right-sided pleuritic lower chest/upper abdominal pain.  She was noted to have thrombocytosis and anemia and was referred to hematology clinic.  When she had a follow-up appointment a broad spectrum of lab work was performed and she was found to have a substantially elevated D-dimer so she was referred back to the emergency department for CTA chest to rule out pulmonary embolism.  The patient reports that she has no shortness of breath.  She has not had any contact with COVID-19 patients.  She denies sore throat, loss of smell and taste, cough, chest pain except as elaborated on below, abdominal pain except as below, nausea, vomiting, diarrhea, and dysuria.  She essentially has no symptoms.  She says that the pain she is experiencing is in the right lower part of her ribs or upper part of her abdomen.  It is worse if she strains or coughs but she essentially has no symptoms and therefore cannot quantify the severity.  Nothing in particular makes it better.  She has no shortness of breath or fatigue with exertion and says that she feels fine.         Past Medical History:  Diagnosis Date  . Anemia     Patient Active Problem List   Diagnosis Date Noted  . Thrombocytosis ([place]) 07/01/2019  . Iron deficiency anemia 07/01/2019  . Hematuria 07/01/2019  . Pleuritic pain 07/01/2019  . CHILDHOOD OBESITY 04/23/2010  . Menorrhagia with regular cycle 04/23/2010    History reviewed. No pertinent  surgical history.  Prior to Admission medications   Medication Sig Start Date End Date Taking? Authorizing Provider  ferrous sulfate 325 (65 FE) MG tablet Take 1 tablet (325 mg total) by mouth daily. 06/27/19 07/27/19 Yes Arta Silence, MD  amoxicillin (AMOXIL) 250 MG capsule Take 3 capsules (750 mg total) by mouth 2 (two) times daily. 750mg  po bid x 10 days qs Patient not taking: Reported on 04/25/2019 08/20/12   Isaac Bliss, MD  ibuprofen (ADVIL) 600 MG tablet Take 1 tablet (600 mg total) by mouth every 6 (six) hours as needed. Patient not taking: Reported on 07/01/2019 06/27/19   Arta Silence, MD    Allergies Patient has no known allergies.  History reviewed. No pertinent family history.  Social History Social History   Tobacco Use  . Smoking status: Never Smoker  . Smokeless tobacco: Never Used  Substance Use Topics  . Alcohol use: Not Currently  . Drug use: Not Currently    Review of Systems Constitutional: No fever/chills Eyes: No visual changes. ENT: No sore throat. Cardiovascular: Right lower chest wall/pleuritic pain. Respiratory: Denies shortness of breath. Gastrointestinal: Right lateral upper abdominal versus lower chest wall pain.  No nausea, no vomiting.  No diarrhea.  No constipation. Genitourinary: Negative for dysuria. Musculoskeletal: Negative for neck pain.  Negative for back pain. Integumentary: Negative for rash. Neurological: Negative for headaches, focal weakness or numbness.   ____________________________________________   PHYSICAL EXAM:  VITAL SIGNS: ED Triage Vitals  Enc Vitals Group  BP 07/01/19 2220 (P) 112/65     Pulse Rate 07/01/19 2220 (P) 90     Resp 07/01/19 2220 (P) 16     Temp 07/01/19 2220 (P) 98.7 F (37.1 C)     Temp Source 07/01/19 2220 (P) Oral     SpO2 07/01/19 2220 (P) 100 %     Weight 07/01/19 2215 89.8 kg (198 lb)     Height 07/01/19 2215 1.626 m (5\' 4" )     Head Circumference --      Peak Flow --       Pain Score 07/01/19 2215 0     Pain Loc --      Pain Edu? --      Excl. in [place]? --     Constitutional: Alert and oriented.  Patient is well-appearing and in no acute distress. Eyes: Conjunctivae are normal.  Head: Atraumatic. Nose: No congestion/rhinnorhea. Mouth/Throat: Patient is wearing a mask. Neck: No stridor.  No meningeal signs.   Cardiovascular: Normal rate, regular rhythm. Good peripheral circulation. Grossly normal heart sounds. Respiratory: Normal respiratory effort.  No retractions. Gastrointestinal: Soft and nontender. No distention.  Musculoskeletal: No lower extremity tenderness nor edema. No gross deformities of extremities. Neurologic:  Normal speech and language. No gross focal neurologic deficits are appreciated.  Skin:  Skin is warm, dry and intact. Psychiatric: Mood and affect are normal. Speech and behavior are normal.  ____________________________________________   LABS (all labs ordered are listed, but only abnormal results are displayed)  Labs Reviewed - No data to display ____________________________________________  EKG  No indication for EKG tonight ____________________________________________  RADIOLOGY I, Hinda Kehr, personally viewed and evaluated these images (plain radiographs) as part of my medical decision making, as well as reviewing the written report by the radiologist.  ED MD interpretation: 1 small thyroid nodule which the radiologist does not recommend following up, no evidence of pulmonary emboli,  Official radiology report(s): Ct Angio Chest Pe W And/or Wo Contrast  Result Date: 07/01/2019 CLINICAL DATA:  Elevated D-dimer, possible pulmonary embolus EXAM: CT ANGIOGRAPHY CHEST WITH CONTRAST TECHNIQUE: Multidetector CT imaging of the chest was performed using the standard protocol during bolus administration of intravenous contrast. Multiplanar CT image reconstructions and MIPs were obtained to evaluate the vascular anatomy.  CONTRAST:  49mL OMNIPAQUE IOHEXOL 350 MG/ML SOLN COMPARISON:  06/27/2019 FINDINGS: Cardiovascular: Thoracic aorta shows a normal branching pattern. No aneurysmal dilatation is seen. The heart is at the upper limits of normal in size. The pulmonary artery shows a normal branching pattern. No definitive pulmonary embolus is noted. Mediastinum/Nodes: The esophagus as visualized is within normal limits. No hilar or mediastinal adenopathy is noted. Thoracic inlet demonstrates a small 8 mm hypodense nodule. Lungs/Pleura: Mild bibasilar atelectatic changes are seen. No sizable effusion or parenchymal nodule is noted. No pneumothorax is seen. Upper Abdomen: Visualized upper abdomen shows no acute abnormality. Musculoskeletal: No acute bony abnormality is seen. Prominent stromal tissue is noted within the breasts bilaterally in a symmetrical fashion. Review of the MIP images confirms the above findings. IMPRESSION: No evidence of pulmonary emboli. 8 mm hypodense nodule within the right lobe of the thyroid. Given the size of the lesion in the patient's age no further follow-up is recommended. Reference: Recommendations for f/u of Incidental Thyroid Nodules (ITN) found on CT, MR, NM and Extrathyroidal [place] are based upon the ACR white paper and [facility] 3-tiered system for managing ITNs:J Am Coll Radiol. 2015 Feb;12(2): 143-50 Electronically Signed   By: Linus Mako.D.  On: 07/01/2019 23:05    ____________________________________________   PROCEDURES   Procedure(s) performed (including Critical Care):  Procedures   ____________________________________________   INITIAL IMPRESSION / MDM / [place] / ED COURSE  As part of my medical decision making, I reviewed the following data within the [place] notes reviewed and incorporated, Labs reviewed , Old chart reviewed, Notes from prior ED visits and [place] Controlled Substance Database   Differential diagnosis includes, but is  not limited to, unspecified hematological issue resulting in abnormal labs including an elevated D-dimer, pulmonary emboli, COVID-19.  Patient is in absolutely no distress.  She has normal vital signs, no tachycardia nor tachypnea, no hypoxemia, and is PERC negative as she was when she was in the ED 4 days ago.  However given his substantially elevated D-dimer at greater then 7400 she was sent to the ED for emergent imaging.  Fortunately the imaging is normal other than a small thyroid nodule which radiology states does not recommend follow-up based on her age and the size of the nodule.  However I have informed her about this nodule in case hematology wants to further investigate as part of her work-up.  She is cleared from pulmonary emboli and will follow up again with Dr. Mike Gip with heme-onc.  I gave my usual and customary return precautions.      Clinical Course as of Jul 01 2331  Mon Jul 01, 2019  2332 Of note, as part of my wrap-up discussion with the patient, I offered COVID-19 testing as another possible explanation for her elevated D-dimer, but she declines at this time which I think is understandable and appropriate given that she has no known contacts and no symptoms of COVID-19.   [CF]    Clinical Course User Index [CF] Hinda Kehr, MD     ____________________________________________  FINAL CLINICAL IMPRESSION(S) / ED DIAGNOSES  Final diagnoses:  Elevated d-dimer  Pleuritic pain  Thrombocytosis ([facility])  Chronic anemia  Thyroid nodule     MEDICATIONS GIVEN DURING THIS VISIT:  Medications  iohexol (OMNIPAQUE) 350 MG/ML injection 75 mL (75 mLs Intravenous Contrast Given 07/01/19 2246)     ED Discharge Orders    None      *Please note:  Bless Hanselman was evaluated in Emergency Department on 07/01/2019 for the symptoms described in the history of present illness. She was evaluated in the context of the global COVID-19 pandemic, which necessitated consideration that  the patient might be at risk for infection with the SARS-CoV-2 virus that causes COVID-19. Institutional protocols and algorithms that pertain to the evaluation of patients at risk for COVID-19 are in a state of rapid change based on information released by regulatory bodies including the CDC and federal and state organizations. These policies and algorithms were followed during the patient's care in the ED.  Some ED evaluations and interventions may be delayed as a result of limited staffing during the pandemic.*  Note:  This document was prepared using Dragon voice recognition software and may include unintentional dictation errors.   Hinda Kehr, MD 07/01/19 [phone]

## 2019-07-01 NOTE — Telephone Encounter (Signed)
Lab called to say patient had a critical lab value for platelets:  929. MD made aware.

## 2019-07-01 NOTE — Telephone Encounter (Signed)
Re:  ER evaluation  I spoke with the patient this evening after our conversation earlier today where we discussed her elevated D-dimer and need for ER evaluation to r/o PE.  She stated that she did not have a ride as her mother was at work and would not be off until 11 pm.  I asked if a friend could drive her for evaluation.  She agreed.  She asked that I contact her aunt, Grace Bushy [phone]), to discuss her lab results and concerns.  Her aunt was contacted.  Several questions were asked and answered.  She stated that she would follow-up with [facility].   Lequita Asal, MD

## 2019-07-01 NOTE — ED Notes (Signed)
Pt sent here to rule out PE per hematologist after being seen in clinic and having elevated d dimer.

## 2019-07-01 NOTE — ED Triage Notes (Signed)
Pt to ED from [facility] after having a ddimer checked that came back elevated. Pt reports she was sent to ED to check if she had a PE. Pt denies having chest pain or SOB. PT is calm in ED.

## 2019-07-01 NOTE — ED Notes (Signed)
Pt otf for imaging 

## 2019-07-02 LAB — VON WILLEBRAND PANEL
Coagulation Factor VIII: 219 % — ABNORMAL HIGH (ref 56–140)
Ristocetin Co-factor, Plasma: 38 % — ABNORMAL LOW (ref 50–200)
Von Willebrand Antigen, Plasma: 87 % (ref 50–200)

## 2019-07-02 LAB — COAG STUDIES INTERP REPORT

## 2019-07-08 ENCOUNTER — Encounter: Payer: Self-pay | Admitting: Hematology and Oncology

## 2019-07-08 ENCOUNTER — Other Ambulatory Visit: Payer: Self-pay | Admitting: Hematology and Oncology

## 2019-07-08 ENCOUNTER — Other Ambulatory Visit: Payer: Self-pay

## 2019-07-08 NOTE — Progress Notes (Signed)
No new changes noted today. The patient Name and DOB has been verified by phone today. 

## 2019-07-09 ENCOUNTER — Inpatient Hospital Stay: Payer: Self-pay

## 2019-07-09 ENCOUNTER — Inpatient Hospital Stay: Payer: Self-pay | Admitting: Hematology and Oncology

## 2019-07-15 ENCOUNTER — Other Ambulatory Visit: Payer: Self-pay

## 2019-07-15 ENCOUNTER — Ambulatory Visit: Payer: Self-pay

## 2019-07-15 ENCOUNTER — Ambulatory Visit: Payer: Self-pay | Admitting: Hematology and Oncology

## 2019-07-15 ENCOUNTER — Other Ambulatory Visit: Payer: Self-pay | Admitting: Hematology and Oncology

## 2019-07-15 DIAGNOSIS — D75839 Thrombocytosis, unspecified: Secondary | ICD-10-CM

## 2019-07-15 DIAGNOSIS — D473 Essential (hemorrhagic) thrombocythemia: Secondary | ICD-10-CM

## 2019-07-15 NOTE — Progress Notes (Signed)
[facility]  [address], [address] [place], [place] [address] Phone: [phone]  Fax: [phone]   Clinic Day:  07/17/2019  Referring physician: Lucille Passy, MD  Chief Complaint: Jacqueline Peters is a 18 y.o. female with iron deficiency anemia and thrombocytosis who is seen for 2 week assessment.  HPI: The patient was last seen in the hematology clinic on 07/01/2019 for initial consultation.  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  Diet was poor.  She had menorrhagia and intermittent hematuria.  She had just started oral iron on 06/27/2019.  She denied any melena or hematochezia.  She denied any shortness of breath but noted pleuritic right sided rib/chest pain. Plan was to continue oral iron BID with OJ or vitamin C.   Work up revealed a hematocrit 27.4, hemoglobin 8.0, MCV 68.3, platelets 929,000, WBC 10,800 (ANC 8,000).  Retic was 1.9%.  PT was 13.4 with an INR of 1.0.  PTT was 35.  Von Willebrand panel included a factor VIII of 219%, ristocetin co-factor 38% (50-200%), and von Willebrand antigen 87%.   Urinalysis revealed large amounts of hemoglobin, small amounts of bilirubin and trace amounts of protein, 11-20 RBCs per HPF and 0-5 WBCs.  There were few bacteria.    D-dimer was 7401.92 (0-499).  She was contacted at home. Results reviewed and directed to the [facility] ER. She was seen in the [facility] ER on 07/01/2019 by Dr. Hinda Kehr.  She noted right lower rib and upper abdominal pain.  Pain was worse if she strained or coughed.  Nothing in particular made it better.  She had no shortness of breath or fatigue with exertion.   Chest CT angiogram on 07/01/2019 showed no evidence of pulmonary emboli. There was a 8 mm hypodense nodule within the right lobe of the thyroid. Given the size of the lesion in the patient's age no further follow was recommended.  During the interim, she has been on oral iron 1/day with orange juice.   Stools are now dark.  She states that the pain in her ribs stopped 3 days after her last visit.  She is eating better (hamburger).  She has heavy menses.  Recent menses was from 07/01/2019 - 07/07/2019 then beginning 07/12/2019 - 07/14/2019.  No menses today.  She notes hematuria even when not on menses.     Past Medical History:  Diagnosis Date  . Anemia     History reviewed. No pertinent surgical history.  History reviewed. No pertinent family history.  Social History:  reports that she has never smoked. She has never used smokeless tobacco. She reports previous alcohol use. She reports previous drug use. The patient smokes black cigars twice a day for the past 3 months.  The patient is accompanied by her mom via the [place] today.  Allergies: No Known Allergies  Current Medications: Current Outpatient Medications  Medication Sig Dispense Refill  . ferrous sulfate 325 (65 FE) MG tablet Take 1 tablet (325 mg total) by mouth daily. 30 tablet 0  . ibuprofen (ADVIL) 600 MG tablet Take 1 tablet (600 mg total) by mouth every 6 (six) hours as needed. 20 tablet 0   No current facility-administered medications for this visit.    Review of Systems  Constitutional: Negative.  Negative for chills, diaphoresis, fever, malaise/fatigue and weight loss (up 12 pounds).       Feels "good".  HENT: Positive for nosebleeds (left sided). Negative for congestion, ear discharge, ear  pain, hearing loss, sinus pain and sore throat.   Eyes: Negative.  Negative for blurred vision and double vision.  Respiratory: Negative for cough (resolved), hemoptysis, sputum production, shortness of breath and wheezing.   Cardiovascular: Negative.  Negative for chest pain (resolved), palpitations, orthopnea, leg swelling and PND.  Gastrointestinal: Negative for abdominal pain, blood in stool, constipation, diarrhea, heartburn, melena, nausea and vomiting.       Normal bowels. Diet, improving.  No ice pica.  Genitourinary:  Positive for hematuria (intermittent NOT associated with menses). Negative for dysuria, flank pain, frequency and urgency.       Heavy menses (use 16 pads per cycle).  Musculoskeletal: Negative.  Negative for back pain, joint pain and myalgias.  Skin: Negative.  Negative for itching and rash.  Neurological: Negative.  Negative for dizziness, tingling, sensory change, speech change, focal weakness, weakness and headaches.  Endo/Heme/Allergies: Negative.  Does not bruise/bleed easily.  Psychiatric/Behavioral: Negative.  Negative for depression and memory loss. The patient is not nervous/anxious and does not have insomnia.   All other systems reviewed and are negative.  Performance status (ECOG): 1  Vitals Blood pressure 115/74, pulse 81, temperature (!) 96.4 F (35.8 C), temperature source Tympanic, resp. rate 18, height 5\' 4"  (1.626 m), weight 202 lb 7.9 oz (91.8 kg), SpO2 100 %.   Physical Exam  Constitutional: She is oriented to person, place, and time. She appears well-developed and well-nourished. No distress.  HENT:  Head: Normocephalic and atraumatic.  Dark hair.   Mask.  Eyes: Conjunctivae and EOM are normal. No scleral icterus.  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 Brown eyes.  Neurological: She is alert and oriented to person, place, and time.  Skin: She is not diaphoretic. No erythema. No pallor.  Psychiatric: She has a normal mood and affect. Her behavior is normal. Judgment and thought content normal.  Nursing note and vitals reviewed.   Orders Only on 07/17/2019  Component Date Value Ref Range Status  . Color, Urine 07/17/2019 YELLOW  YELLOW Final  . APPearance 07/17/2019 HAZY* CLEAR Final  . Specific Gravity, Urine 07/17/2019 1.025  1.005 - 1.030 Final  . pH 07/17/2019 7.5  5.0 - 8.0 Final  . Glucose, UA 07/17/2019 NEGATIVE  NEGATIVE mg/dL Final  . Hgb urine dipstick 07/17/2019 NEGATIVE  NEGATIVE Final  . Bilirubin Urine 07/17/2019 NEGATIVE   NEGATIVE Final  . Ketones, ur 07/17/2019 NEGATIVE  NEGATIVE mg/dL Final  . Protein, ur 07/17/2019 NEGATIVE  NEGATIVE mg/dL Final  . Nitrite 07/17/2019 NEGATIVE  NEGATIVE Final  . Chalmers Guest 07/17/2019 SMALL* NEGATIVE Final  . Squamous Epithelial / LPF 07/17/2019 6-10  0 - 5 Final  . WBC, UA 07/17/2019 6-10  0 - 5 WBC/hpf Final  . RBC / HPF 07/17/2019 NONE SEEN  0 - 5 RBC/hpf Final  . Bacteria, UA 07/17/2019 FEW* NONE SEEN Final  . Amorphous Crystal 07/17/2019 PRESENT   Final   Performed at [facility], [address], [place], [place] [address]  Appointment on 07/17/2019  Component Date Value Ref Range Status  . Specimen Type 07/17/2019 BLOOD   Final  . Cells Counted 07/17/2019 200   Final  . Cells Analyzed 07/17/2019 200   Final  . FISH Result 07/17/2019 Comment:   Final   NORMAL:  NO BCR OR ABL1 GENE REARRANGEMENT OBSERVED  . Interpretation 07/17/2019 Comment:   Final   Comment: (NOTE)             nuc ish  9q34(ASS1,ABL1)x2,22q11.2(BCRx2)[200].      The fluorescence in situ hybridization (FISH) study was normal. FISH, using unique sequence DNA probes for the ABL1 and BCR gene regions showed two ABL1 signals (red), two control ASS1 gene signals (aqua) located adjacent to the ABL1 locus at 9q34, and two BCR signals (green) at 22q11.2 in all interphase nuclei examined. There was NO evidence of CML or ALL-associated BCR/ABL1 dual fusion signals in this analysis. .      This analysis is limited to abnormalities detectable by the specific probes included in the study. FISH results should be interpreted within the context of a full cytogenetic analysis and pathology evaluation. .      This test was developed and its performance characteristics determined by [employer]). It has not been cleared or approved by the U.S. Food and Drug Administration. A BCR-ABL1 gene fusion in greater than 3 interphase nuclei in a                            patient with a new clinical diagnosis is considered positive. The DNA probe vendor for this study was [employer], research (physical sciences)).   . Director Review: 07/17/2019 Comment:   Final   Comment: (NOTE) Chandra Batch, PhD, [facility] Performed At: [facility] [place], [place] [id number] Katina Degree MDPhD [id number] Performed At: [facility] [address] [place], [place] [id number] Rush Farmer MD [id number]   . WBC 07/17/2019 6.3  4.0 - 10.5 K/uL Final  . RBC 07/17/2019 4.28  3.87 - 5.11 MIL/uL Final  . Hemoglobin 07/17/2019 9.4* 12.0 - 15.0 g/dL Final  . HCT 07/17/2019 32.4* 36.0 - 46.0 % Final  . MCV 07/17/2019 75.7* 80.0 - 100.0 fL Final  . MCH 07/17/2019 22.0* 26.0 - 34.0 pg Final  . MCHC 07/17/2019 29.0* 30.0 - 36.0 g/dL Final  . RDW 07/17/2019 26.1* 11.5 - 15.5 % Final  . Platelets 07/17/2019 608* 150 - 400 K/uL Final  . nRBC 07/17/2019 0.0  0.0 - 0.2 % Final  . Neutrophils Relative % 07/17/2019 58  % Final  . Neutro Abs 07/17/2019 3.7  1.7 - 7.7 K/uL Final  . Lymphocytes Relative 07/17/2019 27  % Final  . Lymphs Abs 07/17/2019 1.7  0.7 - 4.0 K/uL Final  . Monocytes Relative 07/17/2019 7  % Final  . Monocytes Absolute 07/17/2019 0.4  0.1 - 1.0 K/uL Final  . Eosinophils Relative 07/17/2019 7  % Final  . Eosinophils Absolute 07/17/2019 0.4  0.0 - 0.5 K/uL Final  . Basophils Relative 07/17/2019 1  % Final  . Basophils Absolute 07/17/2019 0.0  0.0 - 0.1 K/uL Final  . Immature Granulocytes 07/17/2019 0  % Final  . Abs Immature Granulocytes 07/17/2019 0.02  0.00 - 0.07 K/uL Final   Performed at [facility], [address], [place], [place] [address]  . Retic Ct Pct 07/17/2019 3.4* 0.4 - 3.1 % Final  . RBC. 07/17/2019 4.25  3.87 - 5.11 MIL/uL Final  . Retic Count, Absolute 07/17/2019 143.6  19.0 - 186.0 K/uL Final  . Immature Retic Fract 07/17/2019 28.6* 2.3 - 15.9 % Final   Performed at [facility], [address] [place], [place] [address]    Assessment:  Sophiarose Remedies is a 18 y.o. 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  Diet is poor.  She has menorrhagia.  She notes intermittent  hematuria.  She began ferrous sulfate 1 tablet/day on 06/27/2019.  She has a 1 week history of right lower lateral chest pain/rib pain without trauma.  Pain is somewhat pleuritic in nature and is worse with position changes.  She denies any shortness of breath or hemoptysis.  She denies any trauma. Oxygen saturation is 100%.  CXR and rib films on 06/27/2019 were normal.  Labs on 06/27/2019 revealed a hematocrit 25.4, hemoglobin 7.5, MCV 66.3, platelets 979,000, WBC 11,900.  Ferritin was 5 with an iron saturation of 3% with a TIBC of 372.  Total protein was 8.2.  Lipase was 23.   Peripheral smear revealed a microcytic hypochromic iron deficiency anemia. There was mild leukocytosis with unremarkable WBC morphology. There was thrombocytosis with unremarkable platelet morphology. Findings consistent with chronic iron deficiency and secondary reactive thrombocytosis.  Work up on 07/01/2019 revealed a hematocrit 27.4, hemoglobin 8.0, MCV 68.3, platelets 929,000, WBC 10,800 (ANC 8,000).  Retic was 1.9%.  PT was 13.4 with an INR of 1.0.  PTT was 35.  Von Willebrand panel included a factor VIII of 219%, ristocetin co-factor 38% (50-200%), and von Willebrand antigen 87%.   Urinalysis revealed large amounts of hemoglobin, small amounts of bilirubin, trace amounts of protein, 11-20 RBCs per HPF and 0-5 WBCs.  There were few bacteria.    D-dimer was 7401.92 (0-499) on 07/01/2019.  Chest CT angiogram on 07/01/2019 showed no evidence of pulmonary emboli. There was a 8 mm hypodense nodule within the right lobe of the thyroid. Given the size of the lesion in the patient's age no further follow was recommended.  She has a history of bloody right nipple discharge.  Right breast ultrasound on 05/03/2019 revealed  right retroareolar anterior depth corresponding to the site of blood noted on the patient's nipple (1-2 o'clock) was negative. There was no ductal dilation. There was no intraductal mass. There was no suspicious mass or associated feature. Clinical follow up was recommended.   Symptomatically, she is doing well.  She is taking oral iron 1/day.  She notes heavy menses and intermittent hematuria not associated with menses.  Plan: 1.   Labs today: CBC, retic, BCR-ABL, JAK2 with reflex. 2.   Iron deficiency anemia             Hematocrit 25.4.  Hemoglobin 7.5.  MCV 66.3 on 06/27/2019.                         Ferritin 5 with an iron saturation of 3% and a TIBC of 372.             Hematocrit 32.4.  Hemoglobin 9.4.  MCV 75.7 on 07/17/2019.   Retic 3.4%.  Patient notes menorrhagia and intermittent hematuria.             Patient tolerating oral iron 1 tablet/day with OJ.   Increase to BID if possible.  Diet has improved.                         Encourage iron rich foods.  Continue to monitor.   No IV iron needed today. 3.   Thrombocytosis             Platelet count 979,000 on 06/27/2019 and 608,000 on 07/17/2019.             Etiology is felt reactive and due to severe iron deficiency anemia.  Check BCR-ABL and JAK2 with reflex (doubt).  Continue to monitor platelet count closely. 4.   Right sided rib/chest pain, resolved             Etiology was unclear.             Patient denied trauma.             Chest CT angiogram was negative. 5.   Menorrhagia             Patient notes minimal use of ibuprofen and no use of aspirin.             PT and PTT were normal.  Von Willebrand panel revealed ristocetin co-factor 38% (low) with normal von Willebrand factor VIII and von Willebrand antigen.   Low ristocetin co-factor significance unclear- ? vWF gene polymorphism.   Anticipate repeat testing. 6.   Intermittent hematuria  Patient notes h/o hematuria even when not menstruating.  Check  urinalysis.  Consult urology. 7.   RTC in 1 month in labs (CBC, ferritin). 8.   RTC in 2 months for MD assessment and labs (CBC with diff, ferritin).  I discussed the assessment and treatment plan with the patient.  The patient was provided an opportunity to ask questions and all were answered.  The patient agreed with the plan and demonstrated an understanding of the instructions.  The patient was advised to call back if the symptoms worsen or if the condition fails to improve as anticipated.  I provided 15 minutes of face-to-face time during this this encounter and > 50% was spent counseling as documented under my assessment and plan.    Lequita Asal, MD, PhD    07/17/2019, 12:04 PM

## 2019-07-16 ENCOUNTER — Encounter: Payer: Self-pay | Admitting: Hematology and Oncology

## 2019-07-16 NOTE — Progress Notes (Signed)
Rn called pt for pre assessment for visit on 07/17/19.  Pt denies any concerns or difficulties at this time.

## 2019-07-17 ENCOUNTER — Other Ambulatory Visit: Payer: Self-pay

## 2019-07-17 ENCOUNTER — Inpatient Hospital Stay: Payer: Self-pay | Attending: Hematology and Oncology

## 2019-07-17 ENCOUNTER — Inpatient Hospital Stay (HOSPITAL_BASED_OUTPATIENT_CLINIC_OR_DEPARTMENT_OTHER): Payer: Self-pay | Admitting: Hematology and Oncology

## 2019-07-17 ENCOUNTER — Encounter: Payer: Self-pay | Admitting: Hematology and Oncology

## 2019-07-17 ENCOUNTER — Inpatient Hospital Stay: Payer: Self-pay

## 2019-07-17 VITALS — BP 115/74 | HR 81 | Temp 96.4°F | Resp 18 | Ht 64.0 in | Wt 202.5 lb

## 2019-07-17 DIAGNOSIS — E041 Nontoxic single thyroid nodule: Secondary | ICD-10-CM | POA: Insufficient documentation

## 2019-07-17 DIAGNOSIS — D473 Essential (hemorrhagic) thrombocythemia: Secondary | ICD-10-CM

## 2019-07-17 DIAGNOSIS — D75839 Thrombocytosis, unspecified: Secondary | ICD-10-CM

## 2019-07-17 DIAGNOSIS — R319 Hematuria, unspecified: Secondary | ICD-10-CM | POA: Insufficient documentation

## 2019-07-17 DIAGNOSIS — D509 Iron deficiency anemia, unspecified: Secondary | ICD-10-CM

## 2019-07-17 DIAGNOSIS — Z79899 Other long term (current) drug therapy: Secondary | ICD-10-CM | POA: Insufficient documentation

## 2019-07-17 DIAGNOSIS — N920 Excessive and frequent menstruation with regular cycle: Secondary | ICD-10-CM | POA: Insufficient documentation

## 2019-07-17 LAB — RETICULOCYTES
Immature Retic Fract: 28.6 % — ABNORMAL HIGH (ref 2.3–15.9)
RBC.: 4.25 MIL/uL (ref 3.87–5.11)
Retic Count, Absolute: 143.6 10*3/uL (ref 19.0–186.0)
Retic Ct Pct: 3.4 % — ABNORMAL HIGH (ref 0.4–3.1)

## 2019-07-17 LAB — CBC WITH DIFFERENTIAL/PLATELET
Abs Immature Granulocytes: 0.02 10*3/uL (ref 0.00–0.07)
Basophils Absolute: 0 10*3/uL (ref 0.0–0.1)
Basophils Relative: 1 %
Eosinophils Absolute: 0.4 10*3/uL (ref 0.0–0.5)
Eosinophils Relative: 7 %
HCT: 32.4 % — ABNORMAL LOW (ref 36.0–46.0)
Hemoglobin: 9.4 g/dL — ABNORMAL LOW (ref 12.0–15.0)
Immature Granulocytes: 0 %
Lymphocytes Relative: 27 %
Lymphs Abs: 1.7 10*3/uL (ref 0.7–4.0)
MCH: 22 pg — ABNORMAL LOW (ref 26.0–34.0)
MCHC: 29 g/dL — ABNORMAL LOW (ref 30.0–36.0)
MCV: 75.7 fL — ABNORMAL LOW (ref 80.0–100.0)
Monocytes Absolute: 0.4 10*3/uL (ref 0.1–1.0)
Monocytes Relative: 7 %
Neutro Abs: 3.7 10*3/uL (ref 1.7–7.7)
Neutrophils Relative %: 58 %
Platelets: 608 10*3/uL — ABNORMAL HIGH (ref 150–400)
RBC: 4.28 MIL/uL (ref 3.87–5.11)
RDW: 26.1 % — ABNORMAL HIGH (ref 11.5–15.5)
WBC: 6.3 10*3/uL (ref 4.0–10.5)
nRBC: 0 % (ref 0.0–0.2)

## 2019-07-17 LAB — URINALYSIS, COMPLETE (UACMP) WITH MICROSCOPIC
Bilirubin Urine: NEGATIVE
Glucose, UA: NEGATIVE mg/dL
Hgb urine dipstick: NEGATIVE
Ketones, ur: NEGATIVE mg/dL
Nitrite: NEGATIVE
Protein, ur: NEGATIVE mg/dL
RBC / HPF: NONE SEEN RBC/hpf (ref 0–5)
Specific Gravity, Urine: 1.025 (ref 1.005–1.030)
pH: 7.5 (ref 5.0–8.0)

## 2019-07-19 ENCOUNTER — Encounter: Payer: Self-pay | Admitting: Pharmacy Technician

## 2019-07-19 NOTE — Progress Notes (Signed)
Patient has been approved for drug assistance by [employer] for Venofer . The enrollment period is from 07/19/19-07/17/20 based on self pay. First DOS covered is to be scheduled.

## 2019-07-26 LAB — BCR-ABL1 FISH
Cells Analyzed: 200
Cells Counted: 200

## 2019-08-04 NOTE — Progress Notes (Signed)
This encounter was created in error - please disregard.

## 2019-08-05 LAB — CALR + JAK2 E12-15 + MPL (REFLEXED)

## 2019-08-05 LAB — JAK2 V617F, W REFLEX TO CALR/E12/MPL

## 2019-08-14 ENCOUNTER — Inpatient Hospital Stay: Payer: Self-pay | Attending: Hematology and Oncology

## 2019-08-28 ENCOUNTER — Ambulatory Visit: Payer: Self-pay | Admitting: Urology

## 2019-08-28 ENCOUNTER — Encounter: Payer: Self-pay | Admitting: Urology

## 2019-09-10 ENCOUNTER — Inpatient Hospital Stay: Payer: Self-pay | Attending: Hematology and Oncology

## 2019-09-11 ENCOUNTER — Other Ambulatory Visit: Payer: Self-pay

## 2019-09-11 ENCOUNTER — Inpatient Hospital Stay: Payer: Self-pay | Admitting: Hematology and Oncology

## 2019-09-11 ENCOUNTER — Encounter: Payer: Self-pay | Admitting: Hematology and Oncology

## 2019-10-09 ENCOUNTER — Encounter: Payer: Self-pay | Admitting: Hematology and Oncology

## 2019-10-15 NOTE — Progress Notes (Signed)
This encounter was created in error - please disregard.

## 2019-11-05 ENCOUNTER — Other Ambulatory Visit: Payer: Self-pay

## 2019-11-05 ENCOUNTER — Encounter: Payer: Self-pay | Admitting: Hematology and Oncology

## 2019-11-05 ENCOUNTER — Inpatient Hospital Stay: Payer: Self-pay | Attending: Hematology and Oncology

## 2019-11-05 DIAGNOSIS — D75839 Thrombocytosis, unspecified: Secondary | ICD-10-CM

## 2019-11-05 DIAGNOSIS — D473 Essential (hemorrhagic) thrombocythemia: Secondary | ICD-10-CM

## 2019-11-05 DIAGNOSIS — D509 Iron deficiency anemia, unspecified: Secondary | ICD-10-CM | POA: Insufficient documentation

## 2019-11-05 LAB — CBC WITH DIFFERENTIAL/PLATELET
Abs Immature Granulocytes: 0.02 10*3/uL (ref 0.00–0.07)
Basophils Absolute: 0 10*3/uL (ref 0.0–0.1)
Basophils Relative: 0 %
Eosinophils Absolute: 0.4 10*3/uL (ref 0.0–0.5)
Eosinophils Relative: 5 %
HCT: 33.8 % — ABNORMAL LOW (ref 36.0–46.0)
Hemoglobin: 10.7 g/dL — ABNORMAL LOW (ref 12.0–15.0)
Immature Granulocytes: 0 %
Lymphocytes Relative: 35 %
Lymphs Abs: 2.6 10*3/uL (ref 0.7–4.0)
MCH: 25.7 pg — ABNORMAL LOW (ref 26.0–34.0)
MCHC: 31.7 g/dL (ref 30.0–36.0)
MCV: 81.3 fL (ref 80.0–100.0)
Monocytes Absolute: 0.6 10*3/uL (ref 0.1–1.0)
Monocytes Relative: 9 %
Neutro Abs: 3.7 10*3/uL (ref 1.7–7.7)
Neutrophils Relative %: 51 %
Platelets: 373 10*3/uL (ref 150–400)
RBC: 4.16 MIL/uL (ref 3.87–5.11)
RDW: 16 % — ABNORMAL HIGH (ref 11.5–15.5)
WBC: 7.3 10*3/uL (ref 4.0–10.5)
nRBC: 0 % (ref 0.0–0.2)

## 2019-11-05 LAB — FERRITIN: Ferritin: 9 ng/mL — ABNORMAL LOW (ref 11–307)

## 2019-11-05 NOTE — Progress Notes (Unsigned)
Patient denies any concerns or questions

## 2019-11-06 ENCOUNTER — Inpatient Hospital Stay: Payer: Self-pay | Admitting: Hematology and Oncology

## 2019-11-06 ENCOUNTER — Other Ambulatory Visit: Payer: Self-pay

## 2019-11-06 ENCOUNTER — Inpatient Hospital Stay: Payer: Self-pay

## 2019-11-07 ENCOUNTER — Inpatient Hospital Stay: Payer: Self-pay

## 2019-11-07 ENCOUNTER — Inpatient Hospital Stay: Payer: Self-pay | Attending: Hematology and Oncology | Admitting: Hematology and Oncology

## 2019-11-07 DIAGNOSIS — D509 Iron deficiency anemia, unspecified: Secondary | ICD-10-CM | POA: Insufficient documentation

## 2019-11-07 DIAGNOSIS — D7589 Other specified diseases of blood and blood-forming organs: Secondary | ICD-10-CM | POA: Insufficient documentation

## 2019-11-07 NOTE — Progress Notes (Incomplete)
?  [name]  ?[address], [address] ?[place], [place] [address] ?Phone: [phone] ? Fax: [phone] ? ? ?Telemedicine Office Visit:  11/14/2019 ? ?Referring physician: Lucille Passy, MD ? ?I connected with Tikesha Storz on 11/14/2019 at 12:27 PM by videoconferencing and verified that I was speaking with the correct person using 2 identifiers.  The patient was at *** home.  I discussed the limitations, risk, security and privacy concerns of performing an evaluation and management service by videoconferencing and the availability of in person appointments.  I also discussed with the patient that there may be a patient responsible charge related to this service.  The patient expressed understanding and agreed to proceed. ? ?Chief Complaint: Jacqueline Peters is a 19 y.o. female with iron deficiency anemia and thrombocytosis who is seen for 3 month assessment.  ? ?HPI: The patient was last seen in the hematology clinic on 07/17/2019. At that time, she was doing well. She was taking oral iron 1/day. She noted heavy menses and intermittent hematuria not associated with menses. CBC revealed a hematocrit 32.4, hemoglobin 9.4, MCV 75.7, platelets 608,000, WBC 6300, ANC 3700.  Recommendation was to increase oral iron to BID. ? ?Additional labs included negative BCR-ABL, JAK2 V617F, exon 12-15, CALR, and MPL.  Urinalysis revealed no hematuria. ? ?She missed follow-up appointments due to transportation issues.  ? ?CBC followed: ?11/05/2019: Hematocrit 33.8, hemoglobin 10.7, MCV 81.3, platelets 373,000, WBC 7300, ANC 3700.  Ferritin was 9.  ?11/14/2019: Hematocrit 35.0, hemoglobin 11.1, MCV 81.8, platelets 479,000, WBC 5,800. Ferritin was 5. ? ?During the interim, *** ? ?Patient agreed to visit on 11/15/2019 due to being at a job orientation during visit time. ? ?Past Medical History:  ?Diagnosis Date  ?? Anemia   ? ? ?No past surgical history on file. ? ?No family history on file. ? ? Social History:  reports that she has never smoked. She has never used s

## 2019-11-14 ENCOUNTER — Inpatient Hospital Stay: Payer: Self-pay

## 2019-11-14 ENCOUNTER — Other Ambulatory Visit: Payer: Self-pay

## 2019-11-14 ENCOUNTER — Encounter: Payer: Self-pay | Admitting: Hematology and Oncology

## 2019-11-14 ENCOUNTER — Inpatient Hospital Stay: Payer: Self-pay | Admitting: Hematology and Oncology

## 2019-11-14 DIAGNOSIS — D509 Iron deficiency anemia, unspecified: Secondary | ICD-10-CM

## 2019-11-14 LAB — FERRITIN: Ferritin: 5 ng/mL — ABNORMAL LOW (ref 11–307)

## 2019-11-14 LAB — CBC
HCT: 35 % — ABNORMAL LOW (ref 36.0–46.0)
Hemoglobin: 11.1 g/dL — ABNORMAL LOW (ref 12.0–15.0)
MCH: 25.9 pg — ABNORMAL LOW (ref 26.0–34.0)
MCHC: 31.7 g/dL (ref 30.0–36.0)
MCV: 81.8 fL (ref 80.0–100.0)
Platelets: 479 10*3/uL — ABNORMAL HIGH (ref 150–400)
RBC: 4.28 MIL/uL (ref 3.87–5.11)
RDW: 16.1 % — ABNORMAL HIGH (ref 11.5–15.5)
WBC: 5.8 10*3/uL (ref 4.0–10.5)
nRBC: 0 % (ref 0.0–0.2)

## 2019-11-14 NOTE — Progress Notes (Signed)
[facility]  [address] [place], [place] [address] Phone: [phone]  Fax: [phone]   Telemedicine Office Visit:  11/15/2019  Referring physician: Lucille Passy, MD   I connected with Jacqueline Peters on 11/14/2019 at 11:34 AM by videoconferencing and verified that I was speaking with the correct person using 2 identifiers.  The patient was at home.  I discussed the limitations, risk, security and privacy concerns of performing an evaluation and management service by videoconferencing and the availability of in person appointments.  I also discussed with the patient that there may be a patient responsible charge related to this service.  The patient expressed understanding and agreed to proceed.   Chief Complaint: Jacqueline Peters is a 19 y.o. female with iron deficiency anemia and thrombocytosis who is seen for 3 month assessment.   HPI: The patient was last seen in the hematology clinic on 07/17/2019. At that time, she was doing well. She was taking oral iron 1/day. She noted heavy menses and intermittent hematuria not associated with menses. CBC revealed a hematocrit 32.4, hemoglobin 9.4, MCV 75.7, platelets 608,000, WBC 6300, ANC 3700.  Recommendation was to increase oral iron to BID.  Additional labs included negative BCR-ABL, JAK2 V617F, exon 12-15, CALR, and MPL.  Urinalysis revealed no hematuria.  She missed follow-up appointments due to transportation issues.   CBC followed: 11/05/2019: Hematocrit 33.8, hemoglobin 10.7, MCV 81.3, platelets 373,000, WBC 7,300. Ferritin was 9.  11/14/2019: Hematocrit 35.0, hemoglobin 11.1, MCV 81.8, platelets 479,000, WBC 5,800. Ferritin was 5.  Patient agreed to visit on 11/15/2019 due to being at a job orientation during visit time.  During the interim, she has not been taking oral iron since she ran out 3 months ago. When she took it, it did not bother her besides increasing her frequency to use the bathroom. She  asked for a new prescription of oral iron to take twice a day.  She eats meat everyday. She denies ice pica. She drinks a lot of orange juice.   She felt dizzy and light headed 2 days ago, she vommited. She felt fine after she ate. She no longer has issues with nose bleeds. Her periods have been regular and not heavy. She has not seen any blood in her urine. Her weight has been stable. She denies any other symptoms.     Past Medical History:  Diagnosis Date  . Anemia     History reviewed. No pertinent surgical history.  History reviewed. No pertinent family history.  Social History:  reports that she has never smoked. She has never used smokeless tobacco. She reports previous alcohol use. She reports previous drug use. The patient smokes black cigars twice a day for the past 3 months. The patient is alone  today.  Participants in the patient's visit and their role in the encounter included the patient, Heywood Footman, and [employer], CMA, today. The intake visit was provided by Vito Berger, CMA.  Allergies: No Known Allergies  Current Medications: Current Outpatient Medications  Medication Sig Dispense Refill  . ferrous sulfate 325 (65 FE) MG tablet Take 1 tablet (325 mg total) by mouth daily. (Patient not taking: Reported on 11/15/2019) 30 tablet 0  . ibuprofen (ADVIL) 600 MG tablet Take 1 tablet (600 mg total) by mouth every 6 (six) hours as needed. (Patient not taking: Reported on 11/14/2019) 20 tablet 0   No current facility-administered medications for this visit.    Review of Systems  Constitutional: Negative.  Negative for chills, diaphoresis, fever, malaise/fatigue and weight loss (no new weight).       Feels "fine".  HENT: Negative for congestion, ear discharge, ear pain, hearing loss, nosebleeds (left sided), sinus pain and sore throat.   Eyes: Negative.  Negative for blurred vision and double vision.  Respiratory: Negative for cough (resolved), hemoptysis, sputum  production, shortness of breath and wheezing.   Cardiovascular: Negative.  Negative for chest pain (resolved), palpitations, orthopnea, leg swelling and PND.  Gastrointestinal: Negative for abdominal pain, blood in stool, constipation, diarrhea, heartburn, melena, nausea and vomiting.       Normal bowels. Diet, improving.  No ice pica.  Genitourinary: Negative for dysuria, flank pain, frequency, hematuria (none) and urgency.       Prior h/o heavy menses (use 16 pads per cycle). Recent menses regular.  Musculoskeletal: Negative.  Negative for back pain, joint pain and myalgias.  Skin: Negative.  Negative for itching and rash.  Neurological: Negative.  Negative for dizziness, tingling, sensory change, speech change, focal weakness, weakness and headaches.  Endo/Heme/Allergies: Does not bruise/bleed easily.  Psychiatric/Behavioral: Negative.  Negative for depression and memory loss. The patient is not nervous/anxious and does not have insomnia.   All other systems reviewed and are negative.  Performance status (ECOG): 1  Physical Exam Vitals and nursing note reviewed.  Constitutional:      General: She is not in acute distress.    Appearance: She is well-developed and well-nourished. She is not diaphoretic.  HENT:     Head: Normocephalic and atraumatic.  Eyes:     General: No scleral icterus.    Extraocular Movements: EOM normal.     Conjunctiva/sclera: Conjunctivae normal.     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 Brown eyes.  Neurological:     Mental Status: She is alert and oriented to person, place, and time.  Psychiatric:        Mood and Affect: Mood and affect normal.        Behavior: Behavior normal.        Thought Content: Thought content normal.        Judgment: Judgment normal.    Office Visit on 11/14/2019  Component Date Value Ref Range Status  . Ferritin 11/14/2019 5* 11 - 307 ng/mL Final   Performed at [facility], [address] [place], [place] [address]  . WBC 11/14/2019 5.8  4.0 - 10.5 K/uL Final  . RBC 11/14/2019 4.28  3.87 - 5.11 MIL/uL Final  . Hemoglobin 11/14/2019 11.1* 12.0 - 15.0 g/dL Final  . HCT 11/14/2019 35.0* 36.0 - 46.0 % Final  . MCV 11/14/2019 81.8  80.0 - 100.0 fL Final  . MCH 11/14/2019 25.9* 26.0 - 34.0 pg Final  . MCHC 11/14/2019 31.7  30.0 - 36.0 g/dL Final  . RDW 11/14/2019 16.1* 11.5 - 15.5 % Final  . Platelets 11/14/2019 479* 150 - 400 K/uL Final  . nRBC 11/14/2019 0.0  0.0 - 0.2 % Final   Performed at [facility], [address], [place], [place] [address]    Assessment:  Jacqueline Peters is a 19 y.o. female with iron deficiency anemia and reactive thrombocytosis.  Diet is poor.  She has menorrhagia.  She notes intermittent hematuria.  She began ferrous sulfate 1 tablet/day on 06/27/2019.  She has a 1 week history of right lower lateral chest pain/rib pain without trauma.  Pain is somewhat pleuritic in nature and is worse with position changes.  She denies any shortness of breath or hemoptysis.  She denies any trauma. Oxygen saturation is 100%.  CXR and rib films on 06/27/2019 were normal.  Labs on 06/27/2019 revealed a hematocrit 25.4, hemoglobin 7.5, MCV 66.3, platelets 979,000, WBC 11,900.  Ferritin was 5 with an iron saturation of 3% with a TIBC of 372.  Total protein was 8.2.  Lipase was 23.   Ferritin has been followed: 5 on 06/27/2019, 9 on 11/05/2019, and 5 on 11/14/2019.  Peripheral smear revealed a microcytic hypochromic iron deficiency anemia. There was mild leukocytosis with unremarkable WBC morphology. There was thrombocytosis with unremarkable platelet morphology. Findings consistent with chronic iron deficiency and secondary reactive thrombocytosis.  Work up on 07/01/2019 revealed a hematocrit 27.4, hemoglobin 8.0, MCV 68.3, platelets 929,000, WBC 10,800 (ANC 8,000).  Retic was 1.9%.  PT was 13.4 with an INR of 1.0.  PTT was 35.  Von Willebrand panel  included a factor VIII of 219%, ristocetin co-factor 38% (50-200%), and von Willebrand antigen 87%.   Urinalysis revealed large amounts of hemoglobin, small amounts of bilirubin, trace amounts of protein, 11-20 RBCs per HPF and 0-5 WBCs.  There were few bacteria.  Labs on 07/17/2019 were negative for BCR-ABL, JAK2 V617F, exon 12-15, CALR, and MPL.  Urinalysis revealed no hematuria.  D-dimer was 7401.92 (0-499) on 07/01/2019.  Chest CT angiogram on 07/01/2019 showed no evidence of pulmonary emboli. There was a 8 mm hypodense nodule within the right lobe of the thyroid. Given the size of the lesion in the patient's age no further follow was recommended.  She has a history of bloody right nipple discharge.  Right breast ultrasound on 05/03/2019 revealed right retroareolar anterior depth corresponding to the site of blood noted on the patient's nipple (1-2 o'clock) was negative. There was no ductal dilation. There was no intraductal mass. There was no suspicious mass or associated feature. Clinical follow up was recommended.   Symptomatically, she feels "fine".  Menses has not been heavy.  She denies any hematuria.  Plan: 1.   Review labs from 11/05/2019 and 11/14/2019. 2.   Iron deficiency anemia             Hematocrit 25.4.  Hemoglobin 7.5.  MCV 66.3 on 06/27/2019.                         Ferritin 5 with an iron saturation of 3% and a TIBC of 372.             Hematocrit 35.0.  Hemoglobin 11.1.  MCV 81.8 on 11/14/2019.   Ferritin 5.  Patient denies any recent menorrhagia or hematuria.   Urinalysis revealed no hematuria.             She is interested in restarting oral iron.  Begin ferrous sulfate 325 mg p.o. twice daily with orange juice or vitamin C.  3.   Thrombocytosis             Platelet count 479,000 on 11/14/2019.  Etiology reactive and due to severe iron deficiency anemia.  BCR-ABL, JAK2 V617F, exon 12-15, CALR, and MPL negative on 07/17/2019.             Continue to monitor. 4.    Menorrhagia             Patient denies any recent heavy menses   She notes prior history of minimal use of ibuprofen and no use of aspirin.  PT and PTT were normal.  Von Willebrand panel revealed ristocetin co-factor 38% (low) with normal von Willebrand factor VIII and von Willebrand antigen.   Low ristocetin co-factor significance unclear- ? vWF gene polymorphism.   Anticipate repeat testing. 6.   Intermittent hematuria  Urinalysis revealed no hematuria.  Patient denies any noticeable blood in her urine. 7.   RTC in 6 weeks for labs (CBC with diff, ferritin). 8.   RTC in 3 months for MD assessment, labs (CBC with diff, ferritin-day before) and +/- Venofer.  I discussed the assessment and treatment plan with the patient.  The patient was provided an opportunity to ask questions and all were answered.  The patient agreed with the plan and demonstrated an understanding of the instructions.  The patient was advised to call back if the symptoms worsen or if the condition fails to improve as anticipated.    C. Mike Gip, MD, PhD    11/15/2019, 11:34 AM   I, Heywood Footman, am acting as a Education administrator for Lequita Asal, MD.  I, [place] Mike Gip, MD, have reviewed the above documentation for accuracy and completeness, and I agree with the above.

## 2019-11-14 NOTE — Progress Notes (Incomplete)
No new changes noted today 

## 2019-11-15 ENCOUNTER — Inpatient Hospital Stay (HOSPITAL_BASED_OUTPATIENT_CLINIC_OR_DEPARTMENT_OTHER): Payer: Self-pay | Admitting: Hematology and Oncology

## 2019-11-15 ENCOUNTER — Encounter: Payer: Self-pay | Admitting: Hematology and Oncology

## 2019-11-15 DIAGNOSIS — D509 Iron deficiency anemia, unspecified: Secondary | ICD-10-CM

## 2019-11-15 DIAGNOSIS — D75839 Thrombocytosis, unspecified: Secondary | ICD-10-CM

## 2019-11-15 DIAGNOSIS — D473 Essential (hemorrhagic) thrombocythemia: Secondary | ICD-10-CM

## 2019-11-15 MED ORDER — FERROUS SULFATE 325 (65 FE) MG PO TBEC: 325.0000 mg | DELAYED_RELEASE_TABLET | Freq: Two times a day (BID) | ORAL | 3 refills | Status: DC

## 2019-11-15 NOTE — Progress Notes (Signed)
No new changes noted today 

## 2019-12-27 ENCOUNTER — Inpatient Hospital Stay: Payer: Self-pay

## 2019-12-31 ENCOUNTER — Inpatient Hospital Stay: Payer: Self-pay | Attending: Hematology and Oncology

## 2020-01-28 ENCOUNTER — Telehealth: Payer: Self-pay | Admitting: General Practice

## 2020-01-28 NOTE — Telephone Encounter (Signed)
Removed Dr. Deborra Medina as patient's PCP. Does not appear patient has ever been seen at this practice and Dr. Deborra Medina is no longer here.

## 2020-01-31 ENCOUNTER — Encounter: Payer: Self-pay | Admitting: Advanced Practice Midwife

## 2020-01-31 ENCOUNTER — Ambulatory Visit: Payer: Medicaid Other | Admitting: Advanced Practice Midwife

## 2020-01-31 ENCOUNTER — Other Ambulatory Visit: Payer: Self-pay

## 2020-01-31 DIAGNOSIS — Z113 Encounter for screening for infections with a predominantly sexual mode of transmission: Secondary | ICD-10-CM | POA: Diagnosis not present

## 2020-01-31 DIAGNOSIS — Z87891 Personal history of nicotine dependence: Secondary | ICD-10-CM

## 2020-01-31 LAB — WET PREP FOR TRICH, YEAST, CLUE
Trichomonas Exam: NEGATIVE
Yeast Exam: NEGATIVE

## 2020-01-31 MED ORDER — CRYSELLE-28 0.3-30 MG-MCG PO TABS: 1.0000 | ORAL_TABLET | Freq: Every day | ORAL | 13 refills | Status: DC

## 2020-01-31 NOTE — Progress Notes (Signed)
Wet mount reviewed with provider and is negative today. No treatment needed for wet mount per standing order and per E. Sciora, CNM verbal order. Counseled pt per provider orders and pt states understanding. PCP list given to pt per provider verbal order and per pt request. Provider orders completed.

## 2020-01-31 NOTE — Progress Notes (Signed)
[facility] Department STI clinic/screening visit  Subjective:  Joby Hershkowitz is a 19 y.o. SBF nullip exsmoker female being seen today for an STI screening visit. The patient reports they do have symptoms.  Patient reports that they do not desire a pregnancy in the next year.   They reported they are not interested in discussing contraception today.  Patient's last menstrual period was 01/23/2020 (exact date).   Patient has the following medical conditions:   Patient Active Problem List   Diagnosis Date Noted  . Morbid obesity ([place]) 202 lbs 01/31/2020  . Thrombocytosis ([place]) 07/01/2019  . Iron deficiency anemia 07/01/2019  . Hematuria 07/01/2019  . Pleuritic pain 07/01/2019  . CHILDHOOD OBESITY 04/23/2010  . Menorrhagia with regular cycle 04/23/2010    No chief complaint on file.   HPI  Patient reports low abdominal cramps with voiding intermittently x 2 mo and "fishy smell when I'm on my period".    LMP 01/18/20.  Denies sex ever.  Last MJ 10/2019.  Last ETOH 01/24/20 (1 VSOP).  Last Black and Mild cigar 10/2019.  Living with her mom and MGM.  Not in school and not working. Pt states she has never had sex and denies hx physical/sexual abuse.  Pt reports anemia currently and on FeSo4 BID  Last HIV test per patient/review of record was unsure Patient reports last pap was never  See flowsheet for further details and programmatic requirements.    The following portions of the patient's history were reviewed and updated as appropriate: allergies, current medications, past medical history, past social history, past surgical history and problem list.  Objective:  There were no vitals filed for this visit.  Physical Exam Vitals and nursing note reviewed.  Constitutional:      Appearance: Normal appearance.  HENT:     Head: Normocephalic and atraumatic.     Mouth/Throat:     Mouth: Mucous membranes are moist.     Pharynx: Oropharynx is clear. No oropharyngeal exudate or  posterior oropharyngeal erythema.  Eyes:     Conjunctiva/sclera: Conjunctivae normal.  Pulmonary:     Effort: Pulmonary effort is normal.  Abdominal:     Palpations: Abdomen is soft. There is no mass.     Tenderness: There is no abdominal tenderness. There is no rebound.     Comments: Soft without tenderness, fair tone  Genitourinary:    General: Normal vulva.     Exam position: Lithotomy position.     Pubic Area: No rash or pubic lice.      Labia:        Right: No rash or lesion.        Left: No rash or lesion.      Vagina: Normal. No vaginal discharge, erythema, bleeding or lesions.     Cervix: No cervical motion tenderness, discharge, friability, lesion or erythema.     Comments: Pt very tense and unable to tolerate smallest speculum or even Q tip insertion into vagina Lymphadenopathy:     Head:     Right side of head: No preauricular or posterior auricular adenopathy.     Left side of head: No preauricular or posterior auricular adenopathy.     Cervical: No cervical adenopathy.     Upper Body:     Right upper body: No supraclavicular or axillary adenopathy.     Left upper body: No supraclavicular or axillary adenopathy.     Lower Body: No right inguinal adenopathy. No left inguinal adenopathy.  Skin:  General: Skin is warm and dry.     Findings: No rash.  Neurological:     Mental Status: She is alert and oriented to person, place, and time.      Assessment and Plan:  Lateisha Thurlow is a 19 y.o. female presenting to the [facility] Department for STI screening  1. Morbid obesity ([place]) 202 lbs   2. Screening examination for venereal disease Treat wet mount per standing orders Immunization nurse consult - WET PREP FOR [place], YEAST, CLUE - Chlamydia/Gonorrhea  Lab - Syphilis Serology, [place] [facility]     Return if symptoms worsen or fail to improve.  Future Appointments  Date Time Provider [place]  02/05/2020 10:45  AM [facility] LAB [facility] None  02/06/2020  1:45 PM Lequita Asal, MD [facility] None  02/06/2020  2:15 PM [facility] INFUSION CHAIR 2 [facility] None    Herbie Saxon, CNM

## 2020-02-04 ENCOUNTER — Telehealth: Payer: Self-pay | Admitting: General Practice

## 2020-02-04 NOTE — Telephone Encounter (Signed)
Individual has been contacted 3+ times regarding ED referral and has been given information regarding how to become a pt. No further attempts to contact individual will be made.

## 2020-02-05 ENCOUNTER — Inpatient Hospital Stay: Payer: Medicaid Other | Attending: Hematology and Oncology

## 2020-02-06 ENCOUNTER — Inpatient Hospital Stay: Payer: Medicaid Other | Attending: Hematology and Oncology | Admitting: Hematology and Oncology

## 2020-02-06 ENCOUNTER — Telehealth: Payer: Self-pay | Admitting: Hematology and Oncology

## 2020-02-06 ENCOUNTER — Inpatient Hospital Stay: Payer: Medicaid Other

## 2020-02-06 ENCOUNTER — Telehealth: Payer: Self-pay | Admitting: Family Medicine

## 2020-02-06 DIAGNOSIS — D509 Iron deficiency anemia, unspecified: Secondary | ICD-10-CM | POA: Insufficient documentation

## 2020-02-06 NOTE — Telephone Encounter (Signed)
Call to patient to discuss positive TR.  Patient verified by password.  Patient informed of + CH  result.  Patient schedueld for treatment appointment.  Patient verbalizes understanding at this time and has no further questions. Junious Dresser, RN

## 2020-02-06 NOTE — Telephone Encounter (Signed)
  If you push her appt out a couple of weeks we can save her note then delete today's appt.  M

## 2020-02-06 NOTE — Telephone Encounter (Signed)
I called patient and was not able to reach her. Her VM is not set up to leave a message. She missed her appt this week for lab/ see MD/ possible IV Iron.

## 2020-02-07 ENCOUNTER — Telehealth: Payer: Self-pay

## 2020-02-07 NOTE — Telephone Encounter (Signed)
[facility] for chlamydia treatment this am in [facility] Call to client to reschedule appt and left message to reschedule appt with number to call provided. Rich Number, RN

## 2020-02-11 ENCOUNTER — Telehealth: Payer: Self-pay

## 2020-02-11 NOTE — Telephone Encounter (Signed)
[facility] as scheduled in [facility] this pm for chlamydia treatment. Call to client who reports car trouble today and appt rescheduled for tomorrow at 1110 with arrival time of 1100. Rich Number, RN

## 2020-02-12 ENCOUNTER — Other Ambulatory Visit: Payer: Self-pay

## 2020-02-12 ENCOUNTER — Ambulatory Visit: Payer: Medicaid Other

## 2020-02-12 DIAGNOSIS — A749 Chlamydial infection, unspecified: Secondary | ICD-10-CM

## 2020-02-12 MED ORDER — AZITHROMYCIN 500 MG PO TABS
1000.0000 mg | ORAL_TABLET | Freq: Once | ORAL | Status: AC
  Administered 2020-02-12: 1000 mg via ORAL

## 2020-02-25 ENCOUNTER — Inpatient Hospital Stay: Payer: Medicaid Other

## 2020-02-25 ENCOUNTER — Other Ambulatory Visit: Payer: Self-pay

## 2020-02-26 ENCOUNTER — Inpatient Hospital Stay: Payer: Medicaid Other

## 2020-02-26 ENCOUNTER — Inpatient Hospital Stay: Payer: Medicaid Other | Admitting: Hematology and Oncology

## 2020-02-26 ENCOUNTER — Other Ambulatory Visit: Payer: Self-pay

## 2020-02-26 DIAGNOSIS — D509 Iron deficiency anemia, unspecified: Secondary | ICD-10-CM

## 2020-02-26 LAB — CBC WITH DIFFERENTIAL/PLATELET
Abs Immature Granulocytes: 0.01 10*3/uL (ref 0.00–0.07)
Basophils Absolute: 0 10*3/uL (ref 0.0–0.1)
Basophils Relative: 0 %
Eosinophils Absolute: 0.2 10*3/uL (ref 0.0–0.5)
Eosinophils Relative: 2 %
HCT: 34 % — ABNORMAL LOW (ref 36.0–46.0)
Hemoglobin: 11.3 g/dL — ABNORMAL LOW (ref 12.0–15.0)
Immature Granulocytes: 0 %
Lymphocytes Relative: 36 %
Lymphs Abs: 2.6 10*3/uL (ref 0.7–4.0)
MCH: 27.1 pg (ref 26.0–34.0)
MCHC: 33.2 g/dL (ref 30.0–36.0)
MCV: 81.5 fL (ref 80.0–100.0)
Monocytes Absolute: 0.4 10*3/uL (ref 0.1–1.0)
Monocytes Relative: 6 %
Neutro Abs: 4 10*3/uL (ref 1.7–7.7)
Neutrophils Relative %: 56 %
Platelets: 413 10*3/uL — ABNORMAL HIGH (ref 150–400)
RBC: 4.17 MIL/uL (ref 3.87–5.11)
RDW: 13.8 % (ref 11.5–15.5)
WBC: 7.1 10*3/uL (ref 4.0–10.5)
nRBC: 0 % (ref 0.0–0.2)

## 2020-02-26 LAB — FERRITIN: Ferritin: 6 ng/mL — ABNORMAL LOW (ref 11–307)

## 2020-02-27 NOTE — Progress Notes (Addendum)
[facility]  [address] [place] [address] [place], [place] [address] Phone: [phone]  Fax: [phone]   Office Visit:  03/02/2020  Referring physician: Lucille Passy, MD   Chief Complaint: Jacqueline Peters is a 19 y.o. female with iron deficiency anemia and thrombocytosis who is seen for 3 month assessment.   HPI: The patient was last seen in the hematology clinic via telemedicine on 11/15/2019. At that time, she felt "fine".  Menses had not been heavy.  She denied any hematuria.  Hematocrit was 35.0, hemoglobin 11.1, MCV 81.8, and platelets 479,000 on 11/14/2019.  Ferritin was 5.  She was interested in restarting oral iron.  Labs on 02/26/2020 revealed a hematocrit 34.0, hemoglobin 11.3, MCV 81.5, platelets 413,000, WBC 7,100.  Ferritin was 6.  During the interim, she has been "good." Her periods have been normal lately and have been much less heavy. Her diet has been the same. She denies hematuria, shortness of breath, chest pain, dizziness, and ice pica. She was taking oral iron, but ran out last month and did not know she could get it over the counter. It did not bother her stomach. Her diet is stable.  The patient has not received the COVID-19 vaccine.   Past Medical History:  Diagnosis Date  . Anemia     History reviewed. No pertinent surgical history.  History reviewed. No pertinent family history.  Social History:  reports that she has quit smoking. Her smoking use included cigars. She has never used smokeless tobacco. She reports previous alcohol use. She reports previous drug use. Drug: Marijuana. The patient smokes black cigars twice a day for the past 3 months. The patient is alone  today.  Allergies: No Known Allergies  Current Medications: Current Outpatient Medications  Medication Sig Dispense Refill  . ferrous sulfate 325 (65 FE) MG EC tablet Take 1 tablet (325 mg total) by mouth 2 (two) times daily. 60 tablet 3  . ferrous sulfate 325 (65 FE)  MG tablet Take 1 tablet (325 mg total) by mouth daily with breakfast. 60 tablet 1  . ibuprofen (ADVIL) 600 MG tablet Take 1 tablet (600 mg total) by mouth every 6 (six) hours as needed. (Patient not taking: Reported on 11/14/2019) 20 tablet 0   No current facility-administered medications for this visit.    Review of Systems  Constitutional: Negative.  Negative for chills, diaphoresis, fever, malaise/fatigue and weight loss.       Feels "good."  HENT: Negative for congestion, ear discharge, ear pain, hearing loss, nosebleeds, sinus pain, sore throat and tinnitus.   Eyes: Negative.  Negative for blurred vision and double vision.  Respiratory: Negative for cough, hemoptysis, sputum production, shortness of breath and wheezing.   Cardiovascular: Negative.  Negative for chest pain, palpitations, orthopnea, leg swelling and PND.  Gastrointestinal: Negative for abdominal pain, blood in stool, constipation, diarrhea, heartburn, melena, nausea and vomiting.       Normal bowels. Diet stable. No ice pica.  Genitourinary: Negative for dysuria, flank pain, frequency, hematuria and urgency.       Prior h/o heavy menses (use 16 pads per cycle). Recent menses regular.  Musculoskeletal: Negative.  Negative for back pain, joint pain, myalgias and neck pain.  Skin: Negative.  Negative for itching and rash.  Neurological: Negative.  Negative for dizziness, tingling, sensory change, weakness and headaches.  Endo/Heme/Allergies: Does not bruise/bleed easily.  Psychiatric/Behavioral: Negative.  Negative for depression and memory loss. The patient is not nervous/anxious and does not have  insomnia.   All other systems reviewed and are negative.  Performance status (ECOG): 0  Vital Signs Blood pressure (!) 120/63, pulse 76, temperature (!) 95.1 F (35.1 C), temperature source Tympanic, resp. rate 16, weight (!) 219 lb 9.3 oz (99.6 kg), SpO2 100 %.  Physical Exam Vitals and nursing note reviewed.   Constitutional:      General: She is not in acute distress.    Appearance: She is well-developed. She is not diaphoretic.  HENT:     Head: Normocephalic and atraumatic.     Mouth/Throat:     Mouth: Mucous membranes are moist.  Eyes:     General: No scleral icterus.    Conjunctiva/sclera: Conjunctivae normal.     Pupils: Pupils are equal, round, and reactive to light.     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 Brown eyes.  Cardiovascular:     Rate and Rhythm: Normal rate and regular rhythm.     Pulses: Normal pulses.     Heart sounds: Normal heart sounds.  Pulmonary:     Effort: Pulmonary effort is normal.     Breath sounds: Normal breath sounds.  Abdominal:     General: Abdomen is flat. Bowel sounds are normal. There is no distension.     Palpations: Abdomen is soft. There is no hepatomegaly, splenomegaly or mass.  Musculoskeletal:        General: No swelling or tenderness. Normal range of motion.     Cervical back: Normal range of motion and neck supple.  Lymphadenopathy:     Head:     Right side of head: No preauricular, posterior auricular or occipital adenopathy.     Left side of head: No preauricular or posterior auricular adenopathy.     Cervical: No cervical adenopathy.     Upper Body:     Right upper body: No supraclavicular or axillary adenopathy.     Left upper body: No supraclavicular or axillary adenopathy.     Lower Body: No right inguinal adenopathy. No left inguinal adenopathy.  Skin:    General: Skin is warm and dry.  Neurological:     Mental Status: She is alert and oriented to person, place, and time. Mental status is at baseline.     Gait: Gait normal.  Psychiatric:        Behavior: Behavior normal.        Thought Content: Thought content normal.        Judgment: Judgment normal.    No visits with results within 3 Day(s) from this visit.  Latest known visit with results is:  Appointment on 02/26/2020  Component  Date Value Ref Range Status  . Ferritin 02/26/2020 6* 11 - 307 ng/mL Final   Performed at [facility], [address] [place], [place] [address]  . WBC 02/26/2020 7.1  4.0 - 10.5 K/uL Final  . RBC 02/26/2020 4.17  3.87 - 5.11 MIL/uL Final  . Hemoglobin 02/26/2020 11.3* 12.0 - 15.0 g/dL Final  . HCT 02/26/2020 34.0* 36 - 46 % Final  . MCV 02/26/2020 81.5  80.0 - 100.0 fL Final  . MCH 02/26/2020 27.1  26.0 - 34.0 pg Final  . MCHC 02/26/2020 33.2  30.0 - 36.0 g/dL Final  . RDW 02/26/2020 13.8  11.5 - 15.5 % Final  . Platelets 02/26/2020 413* 150 - 400 K/uL Final  . nRBC 02/26/2020 0.0  0.0 - 0.2 % Final  . Neutrophils Relative % 02/26/2020 56  % Final  .  Neutro Abs 02/26/2020 4.0  1.7 - 7.7 K/uL Final  . Lymphocytes Relative 02/26/2020 36  % Final  . Lymphs Abs 02/26/2020 2.6  0.7 - 4.0 K/uL Final  . Monocytes Relative 02/26/2020 6  % Final  . Monocytes Absolute 02/26/2020 0.4  0 - 1 K/uL Final  . Eosinophils Relative 02/26/2020 2  % Final  . Eosinophils Absolute 02/26/2020 0.2  0 - 0 K/uL Final  . Basophils Relative 02/26/2020 0  % Final  . Basophils Absolute 02/26/2020 0.0  0 - 0 K/uL Final  . Immature Granulocytes 02/26/2020 0  % Final  . Abs Immature Granulocytes 02/26/2020 0.01  0.00 - 0.07 K/uL Final   Performed at [facility], [address], [place], [place] [address]    Assessment:  Sonnet Rizor is a 19 y.o. female with iron deficiency anemia and reactive thrombocytosis.  Diet is poor.  She has menorrhagia.  She notes intermittent hematuria.  She began ferrous sulfate 1 tablet/day on 06/27/2019.  She has a 1 week history of right lower lateral chest pain/rib pain without trauma.  Pain is somewhat pleuritic in nature and is worse with position changes.  She denies any shortness of breath or hemoptysis.  She denies any trauma. Oxygen saturation is 100%.  CXR and rib films on 06/27/2019 were normal.  Labs on 06/27/2019 revealed a hematocrit 25.4, hemoglobin  7.5, MCV 66.3, platelets 979,000, WBC 11,900.  Ferritin was 5 with an iron saturation of 3% with a TIBC of 372.  Total protein was 8.2.  Lipase was 23.   Ferritin has been followed: 5 on 06/27/2019, 9 on 11/05/2019, 5 on 11/14/2019, and 6 on 02/26/2020.  Peripheral smear revealed a microcytic hypochromic iron deficiency anemia. There was mild leukocytosis with unremarkable WBC morphology. There was thrombocytosis with unremarkable platelet morphology. Findings consistent with chronic iron deficiency and secondary reactive thrombocytosis.  Work up on 07/01/2019 revealed a hematocrit 27.4, hemoglobin 8.0, MCV 68.3, platelets 929,000, WBC 10,800 (ANC 8,000).  Retic was 1.9%.  PT was 13.4 with an INR of 1.0.  PTT was 35.  Von Willebrand panel included a factor VIII of 219%, ristocetin co-factor 38% (50-200%), and von Willebrand antigen 87%.   Urinalysis revealed large amounts of hemoglobin, small amounts of bilirubin, trace amounts of protein, 11-20 RBCs per HPF and 0-5 WBCs.  There were few bacteria.  Labs on 07/17/2019 were negative for BCR-ABL, JAK2 V617F, exon 12-15, CALR, and MPL.  Urinalysis revealed no hematuria.  D-dimer was 7401.92 (0-499) on 07/01/2019.  Chest CT angiogram on 07/01/2019 showed no evidence of pulmonary emboli. There was a 8 mm hypodense nodule within the right lobe of the thyroid. Given the size of the lesion in the patient's age no further follow was recommended.  She has a history of bloody right nipple discharge.  Right breast ultrasound on 05/03/2019 revealed right retroareolar anterior depth corresponding to the site of blood noted on the patient's nipple (1-2 o'clock) was negative. There was no ductal dilation. There was no intraductal mass. There was no suspicious mass or associated feature. Clinical follow up was recommended.   Shas not received the COVID-19 vaccine.  Symptomatically, she has felt "good".  Menses has been not been heavy.  She ran out of oral iron 1 month  ago.  Plan: 1.   Review labs from 02/26/2020. 2.   Iron deficiency anemia             Hematocrit 25.4.  Hemoglobin 7.5.  MCV 66.3 on  06/27/2019.                         Ferritin 5 with an iron saturation of 3% and a TIBC of 372.             Hematocrit 35.0.  Hemoglobin 11.1.  MCV 81.8 on 11/14/2019.   Ferritin 5.  Hematocrit 34.0.  Hemoglobin 11.3.  MCV 81.5 on 02/26/2020.   Ferritin 6.  Patient denies hematuria or heavy menses.             Restart oral iron 1 tablet a day with orange juice or vitamin C.   Increase dose to 1 pill twice a day if tolerated.  Rx: ferrous sulfate 325 mg p.o. daily (dispense #60 with 1 refill). 3.   Thrombocytosis             Platelet count 479,000 on 11/14/2019.  Platelet count 413,000 on 02/26/2020 (improved).  Etiology felt reactive and due to iron deficiency anemia.  BCR-ABL, JAK2 V617F, exon 12-15, CALR, and MPL negative on 07/17/2019.             Continue to monitor. 4.   Menorrhagia             Patient denies any recent heavy menses.  She notes prior history of minimal use of ibuprofen and no use of aspirin.             PT and PTT were normal.  Von Willebrand panel revealed ristocetin co-factor 38% (low) with normal von Willebrand factor VIII and von Willebrand antigen.   Low ristocetin co-factor significance unclear- ? vWF gene polymorphism.   Anticipate repeat testing.  Continue to monitor. 6.   No Venofer today. 7.   RTC in 3 months labs (CBC, ferritin). 8.   RTC in 6 months for MD assessment, labs (CBC with diff, ferritin, iron studies-day before) and +/- Venofer.  I discussed the assessment and treatment plan with the patient.  The patient was provided an opportunity to ask questions and all were answered.  The patient agreed with the plan and demonstrated an understanding of the instructions.  The patient was advised to call back if the symptoms worsen or if the condition fails to improve as anticipated.   Rissie Sculley C. Mike Gip, MD, PhD     03/02/2020, 1:23 PM  I, De Burrs, am acting as scribe for [employer]. Mike Gip, MD, PhD.  I, Brentlee Delage C. Mike Gip, MD, have reviewed the above documentation for accuracy and completeness, and I agree with the above.

## 2020-02-28 ENCOUNTER — Other Ambulatory Visit: Payer: Self-pay

## 2020-02-28 ENCOUNTER — Encounter: Payer: Self-pay | Admitting: Hematology and Oncology

## 2020-02-28 NOTE — Progress Notes (Signed)
No new changes noted today. The patient name and DOB has been verified by phone today. 

## 2020-03-02 ENCOUNTER — Other Ambulatory Visit: Payer: Self-pay

## 2020-03-02 ENCOUNTER — Encounter: Payer: Self-pay | Admitting: Hematology and Oncology

## 2020-03-02 ENCOUNTER — Inpatient Hospital Stay (HOSPITAL_BASED_OUTPATIENT_CLINIC_OR_DEPARTMENT_OTHER): Payer: Medicaid Other | Admitting: Hematology and Oncology

## 2020-03-02 ENCOUNTER — Ambulatory Visit: Payer: Medicaid Other

## 2020-03-02 VITALS — BP 120/63 | HR 76 | Temp 95.1°F | Resp 16 | Wt 219.6 lb

## 2020-03-02 DIAGNOSIS — D473 Essential (hemorrhagic) thrombocythemia: Secondary | ICD-10-CM

## 2020-03-02 DIAGNOSIS — D509 Iron deficiency anemia, unspecified: Secondary | ICD-10-CM | POA: Diagnosis not present

## 2020-03-02 DIAGNOSIS — D75839 Thrombocytosis, unspecified: Secondary | ICD-10-CM

## 2020-03-02 MED ORDER — FERROUS SULFATE 325 (65 FE) MG PO TABS: 325.0000 mg | ORAL_TABLET | Freq: Every day | ORAL | 1 refills | Status: DC

## 2020-03-02 NOTE — Patient Instructions (Signed)
  Restart oral iron 1 pill a day with orange juice or vitamin C.  Can increase to 1 pill twice a day if tolerated.

## 2020-04-28 ENCOUNTER — Other Ambulatory Visit: Payer: Self-pay

## 2020-04-28 ENCOUNTER — Emergency Department
Admission: EM | Admit: 2020-04-28 | Discharge: 2020-04-28 | Disposition: A | Discharge status: Home / Self Care | Payer: Medicaid Other | Attending: Emergency Medicine | Admitting: Emergency Medicine

## 2020-04-28 DIAGNOSIS — Z87891 Personal history of nicotine dependence: Secondary | ICD-10-CM | POA: Diagnosis not present

## 2020-04-28 DIAGNOSIS — J029 Acute pharyngitis, unspecified: Secondary | ICD-10-CM | POA: Diagnosis not present

## 2020-04-28 DIAGNOSIS — R05 Cough: Secondary | ICD-10-CM | POA: Diagnosis present

## 2020-04-28 DIAGNOSIS — Z20822 Contact with and (suspected) exposure to covid-19: Secondary | ICD-10-CM | POA: Diagnosis not present

## 2020-04-28 LAB — SARS CORONAVIRUS 2 BY RT PCR (HOSPITAL ORDER, PERFORMED IN ~~LOC~~ HOSPITAL LAB): SARS Coronavirus 2: NEGATIVE

## 2020-04-28 LAB — GROUP A STREP BY PCR: Group A Strep by PCR: NOT DETECTED

## 2020-04-28 MED ORDER — PSEUDOEPH-BROMPHEN-DM 30-2-10 MG/5ML PO SYRP: 5.0000 mL | ORAL_SOLUTION | Freq: Four times a day (QID) | ORAL | 0 refills | Status: DC | PRN

## 2020-04-28 MED ORDER — LIDOCAINE VISCOUS HCL 2 % MT SOLN: 5.0000 mL | Freq: Four times a day (QID) | OROMUCOSAL | 0 refills | Status: DC | PRN

## 2020-04-28 NOTE — ED Notes (Signed)
See triage note  Presents with cough,congestion and sore throat    No fever

## 2020-04-28 NOTE — Discharge Instructions (Addendum)
Your strep /COVID-19 test was negative.  Follow discharge care instruction take medication as directed.

## 2020-04-28 NOTE — ED Provider Notes (Signed)
[facility] Emergency Department Provider Note   ____________________________________________   First MD Initiated Contact with Patient 04/28/20 1055     (approximate)  I have reviewed the triage vital signs and the nursing notes.   HISTORY  Chief Complaint URI    HPI Jacqueline Peters is a 19 y.o. female patient presents with sore throat and cough for 3 days.  Denies fever sinus drainage.  Denies loss of taste or smell.  Patient denies recent travel or known contact with COVID-19.  Patient sister is here with similar complaints.  Patient is not taking COVID-19 vaccine.         Past Medical History:  Diagnosis Date  . Anemia     Patient Active Problem List   Diagnosis Date Noted  . Morbid obesity ([place]) 202 lbs 01/31/2020  . Ex-smoker--last Black & Mild 10/2019 01/31/2020  . Thrombocytosis ([place]) 07/01/2019  . Iron deficiency anemia 07/01/2019  . Hematuria 07/01/2019  . Pleuritic pain 07/01/2019  . CHILDHOOD OBESITY 04/23/2010  . Menorrhagia with regular cycle 04/23/2010    History reviewed. No pertinent surgical history.  Prior to Admission medications   Medication Sig Start Date End Date Taking? Authorizing Provider  brompheniramine-pseudoephedrine-DM 30-2-10 MG/5ML syrup Take 5 mLs by mouth 4 (four) times daily as needed. Mix with 5 mL of viscous lidocaine for slow swish swish and swallow. 04/28/20   Sable Feil, PA-C  ferrous sulfate 325 (65 FE) MG EC tablet Take 1 tablet (325 mg total) by mouth 2 (two) times daily. 11/15/19   Lequita Asal, MD  ferrous sulfate 325 (65 FE) MG tablet Take 1 tablet (325 mg total) by mouth daily with breakfast. 03/02/20 04/01/20  Lequita Asal, MD  lidocaine (XYLOCAINE) 2 % solution Use as directed 5 mLs in the mouth or throat every 6 (six) hours as needed for mouth pain. 5 mL for swish and slow swallow. 04/28/20   Sable Feil, PA-C  norgestrel-ethinyl estradiol (CRYSELLE-28) 0.3-30 MG-MCG tablet Take  1 tablet by mouth daily. 01/31/20 01/31/20  Herbie Saxon, CNM    Allergies Patient has no known allergies.  No family history on file.  Social History Social History   Tobacco Use  . Smoking status: Former Smoker    Types: Cigars  . Smokeless tobacco: Never Used  Vaping Use  . Vaping Use: Never used  Substance Use Topics  . Alcohol use: Not Currently    Comment: last use 01/24/20   . Drug use: Not Currently    Types: Marijuana    Comment: last MJ 10/2019    Review of Systems Constitutional: No fever/chills Eyes: No visual changes. ENT: Sore throat.   Cardiovascular: Denies chest pain. Respiratory: Denies shortness of breath.  Nonproductive cough. Gastrointestinal: No abdominal pain.  No nausea, no vomiting.  No diarrhea.  No constipation. Genitourinary: Negative for dysuria. Musculoskeletal: Negative for back pain. Skin: Negative for rash. Neurological: Negative for headaches, focal weakness or numbness. Hematological/Lymphatic:  Anemia  ____________________________________________   PHYSICAL EXAM:  VITAL SIGNS: ED Triage Vitals  Enc Vitals Group     BP 04/28/20 1039 121/74     Pulse --      Resp 04/28/20 1039 16     Temp 04/28/20 1039 98.3 F (36.8 C)     Temp Source 04/28/20 1039 Oral     SpO2 04/28/20 1039 100 %     Weight 04/28/20 1039 200 lb (90.7 kg)     Height 04/28/20 1039 5\' 4"  (  1.626 m)     Head Circumference --      Peak Flow --      Pain Score 04/28/20 1043 0     Pain Loc --      Pain Edu? --      Excl. in [place]? --     Constitutional: Alert and oriented. Well appearing and in no acute distress. Eyes: Conjunctivae are normal. PERRL. EOMI. Head: Atraumatic. Nose: No congestion/rhinnorhea. Mouth/Throat: Mucous membranes are moist.  Oropharynx erythematous. Neck: No stridor. Hematological/Lymphatic/Immunilogical: No cervical lymphadenopathy. Cardiovascular: Normal rate, regular rhythm. Grossly normal heart sounds.  Good peripheral  circulation. Respiratory: Normal respiratory effort.  No retractions. Lungs CTAB. Skin:  Skin is warm, dry and intact. No rash noted. Psychiatric: Mood and affect are normal. Speech and behavior are normal.  ____________________________________________   LABS (all labs ordered are listed, but only abnormal results are displayed)  Labs Reviewed  GROUP A STREP BY PCR  SARS CORONAVIRUS 2 BY RT PCR (HOSPITAL ORDER, [facility])   ____________________________________________  EKG   ____________________________________________  [facility]  ED MD interpretation:    Official radiology report(s): No results found.  ____________________________________________   PROCEDURES  Procedure(s) performed (including Critical Care):  Procedures   ____________________________________________   INITIAL IMPRESSION / ASSESSMENT AND PLAN / ED COURSE  As part of my medical decision making, I reviewed the following data within the [facility]     Patient presents with sore throat and cough.  Patient strep test was negative.  Patient COVID-19 test was negative.  Patient given discharge care instruction for viral pharyngitis and advised take medication as directed.          ____________________________________________   FINAL CLINICAL IMPRESSION(S) / ED DIAGNOSES  Final diagnoses:  Acute viral pharyngitis     ED Discharge Orders         Ordered    lidocaine (XYLOCAINE) 2 % solution  Every 6 hours PRN        04/28/20 1233    brompheniramine-pseudoephedrine-DM 30-2-10 MG/5ML syrup  4 times daily PRN        04/28/20 1236          *Please note:  Jacqueline Peters was evaluated in Emergency Department on 04/28/2020 for the symptoms described in the history of present illness. She was evaluated in the context of the global COVID-19 pandemic, which necessitated consideration that the patient might be at risk for infection with the SARS-CoV-2  virus that causes COVID-19. Institutional protocols and algorithms that pertain to the evaluation of patients at risk for COVID-19 are in a state of rapid change based on information released by regulatory bodies including the CDC and federal and state organizations. These policies and algorithms were followed during the patient's care in the ED.  Some ED evaluations and interventions may be delayed as a result of limited staffing during and the pandemic.*   Note:  This document was prepared using Dragon voice recognition software and may include unintentional dictation errors.    Sable Feil, PA-C 04/28/20 1237    Lavonia Drafts, MD 04/29/20 [phone]

## 2020-04-28 NOTE — ED Triage Notes (Signed)
Pt c/o sore throat with cough for the past 3 days. Denies fever or sinus drainage

## 2020-05-22 NOTE — Progress Notes (Signed)
The following Assist/Replace Program for Venofer from [facility] has been terminated due to [facility] Coverage starting 02/06/2020.  Last DOS: none

## 2020-06-01 ENCOUNTER — Other Ambulatory Visit: Payer: Medicaid Other

## 2020-06-14 IMAGING — CT CT ANGIO CHEST
1 of 4 series · 19 of 46 positions shown · IV contrast (APPLIED)
Comparison: 06/27/2019

CLINICAL DATA: Elevated D-dimer, possible pulmonary embolus

EXAM:
CT ANGIOGRAPHY CHEST WITH CONTRAST
TECHNIQUE: Multidetector CT imaging of the chest was performed using the
standard protocol during bolus administration of intravenous
contrast. Multiplanar CT image reconstructions and MIPs were
obtained to evaluate the vascular anatomy.
CONTRAST:  75mL OMNIPAQUE IOHEXOL 350 MG/ML SOLN

[Series 5: axial st · axial · 0.72mm/px · z∈[-775,-526]mm · 19 of 89 slices shown]
[im 3/89  lung]
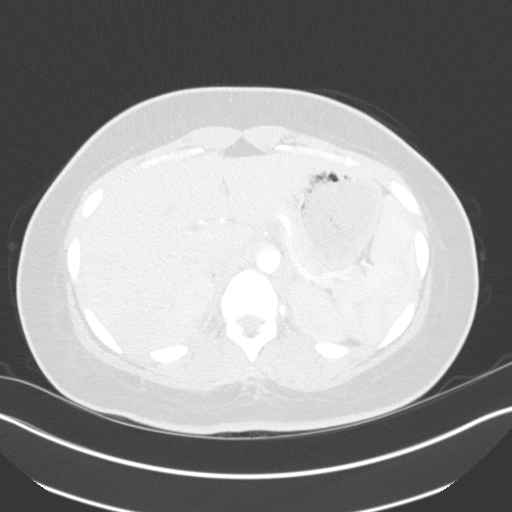
[im 9/89  soft-tissue]
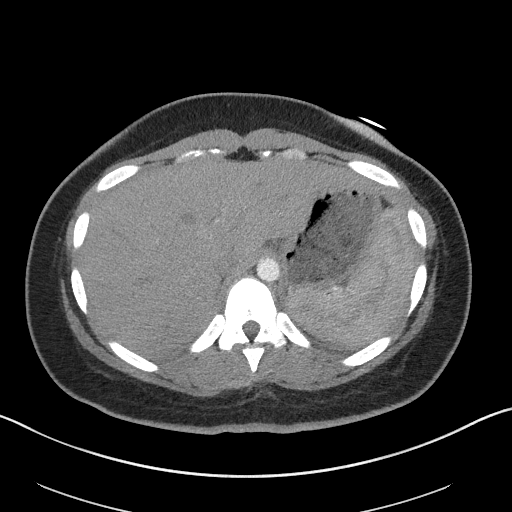
[im 12/89  lung]
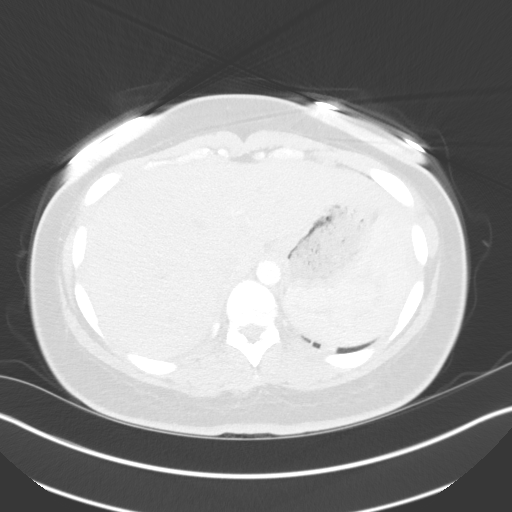
[im 18/89  soft-tissue]
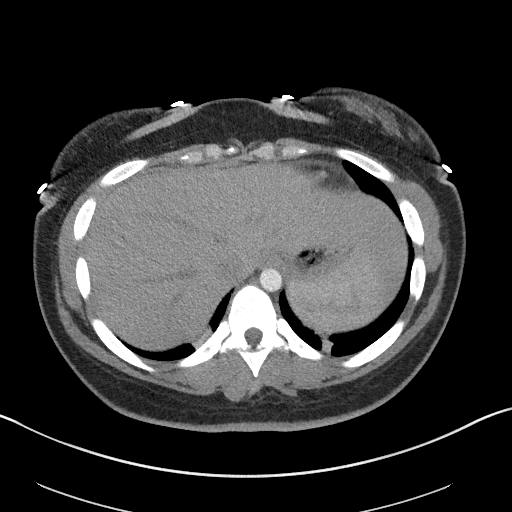
[im 23/89  lung]
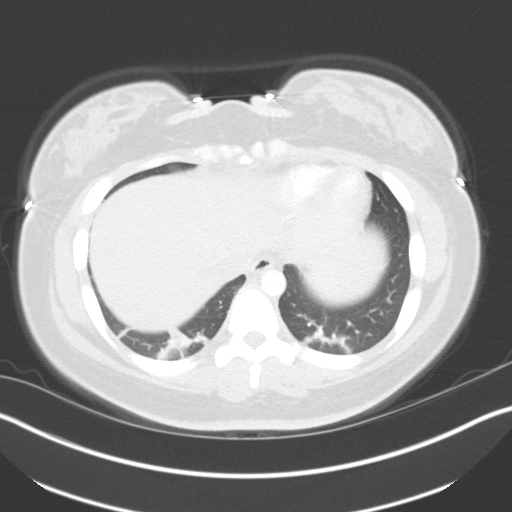
[im 26/89  soft-tissue]
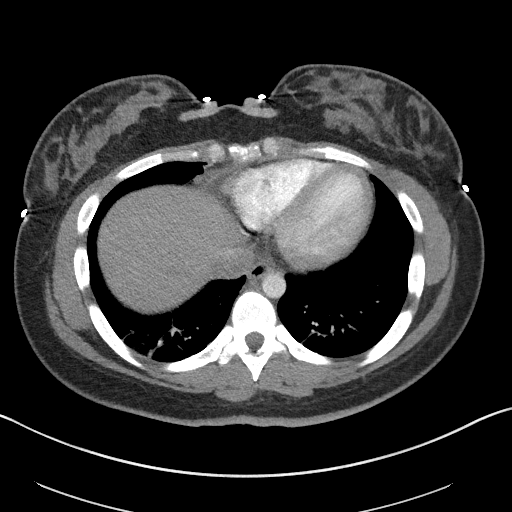
[im 32/89  lung]
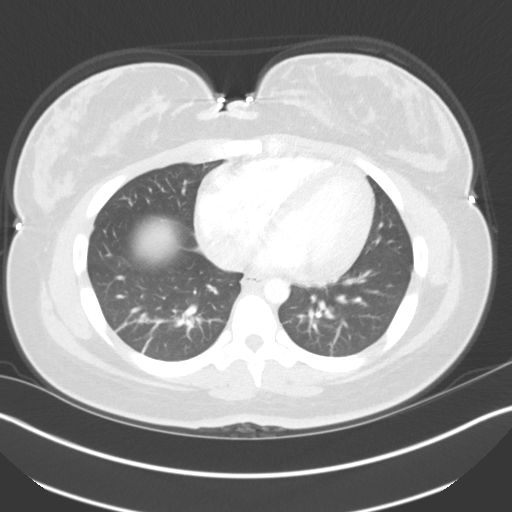
[im 35/89  soft-tissue]
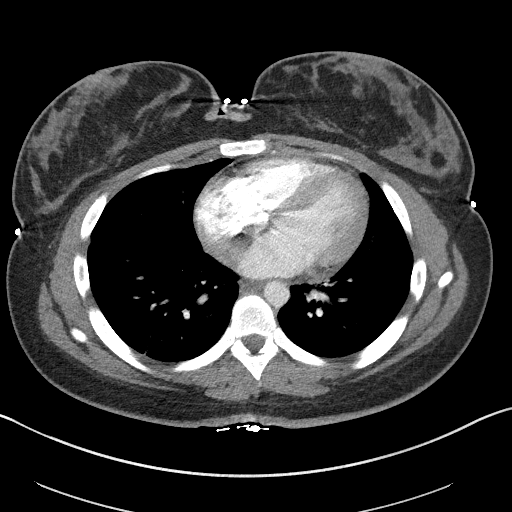
[im 40/89  lung]
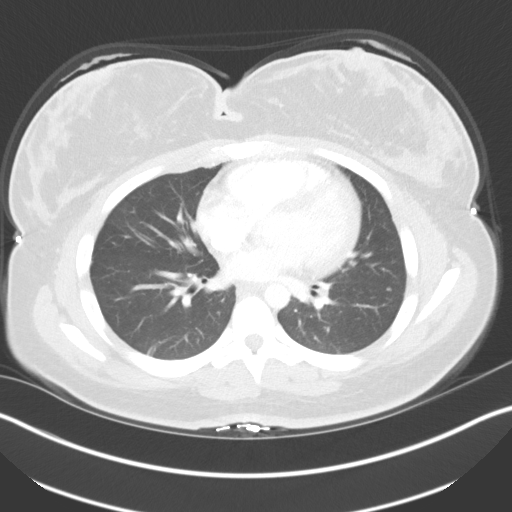
[im 46/89  soft-tissue]
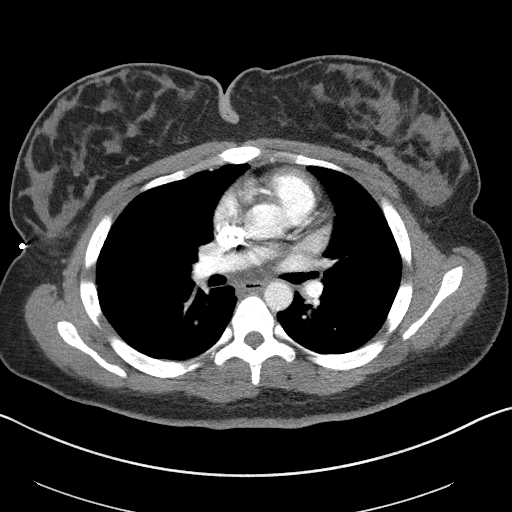
[im 49/89  lung]
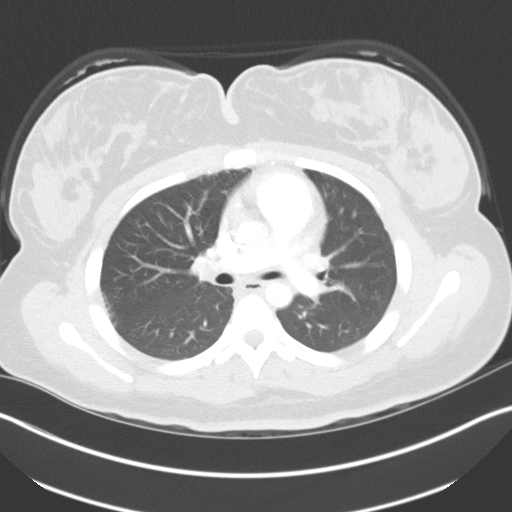
[im 54/89  soft-tissue]
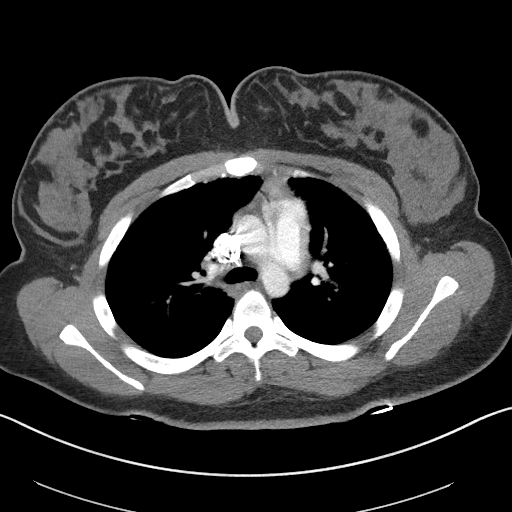
[im 57/89  lung]
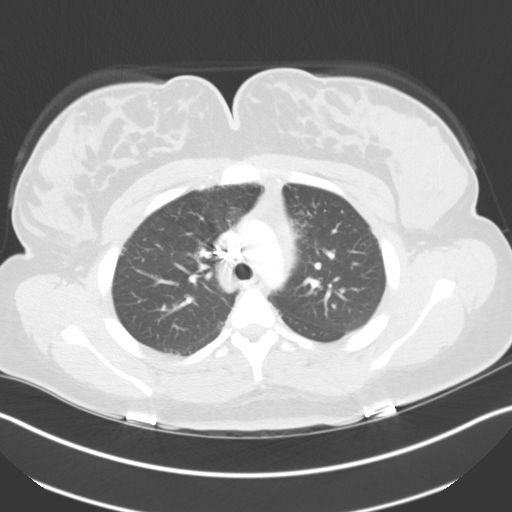
[im 63/89  soft-tissue]
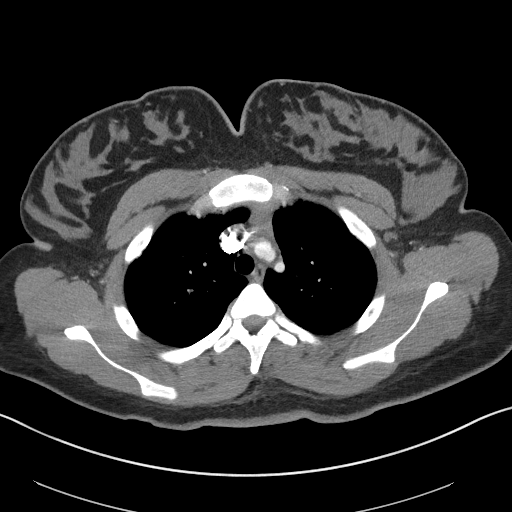
[im 66/89  lung]
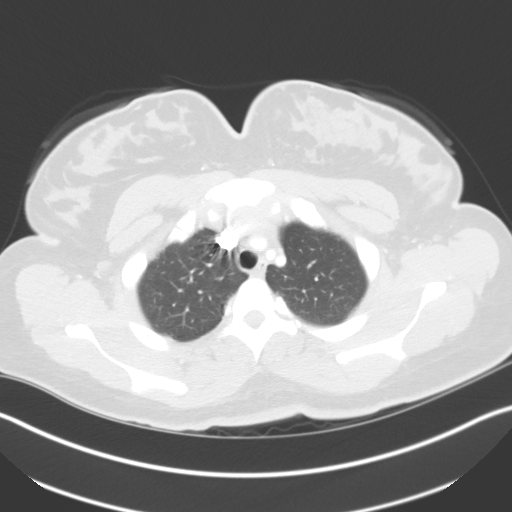
[im 71/89  soft-tissue]
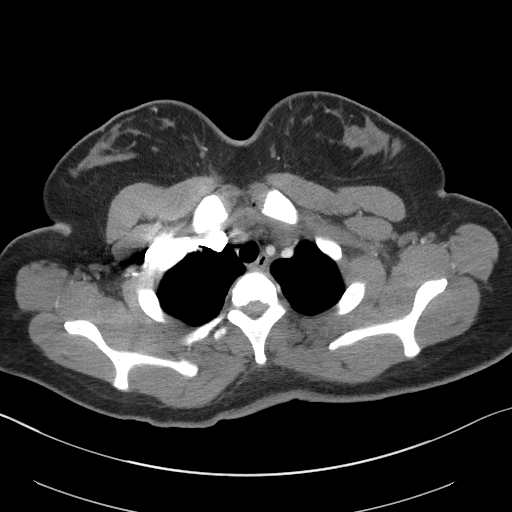
[im 77/89  lung]
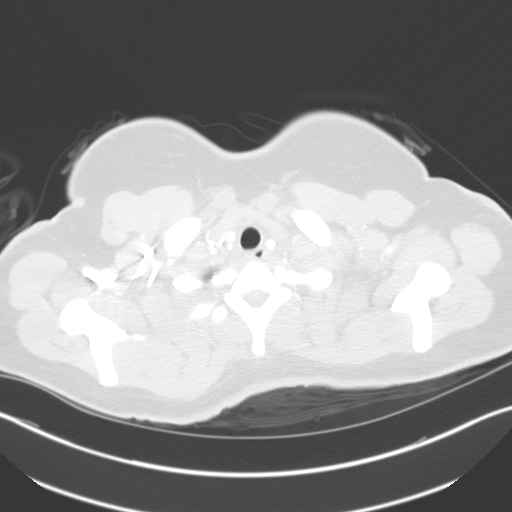
[im 80/89  soft-tissue]
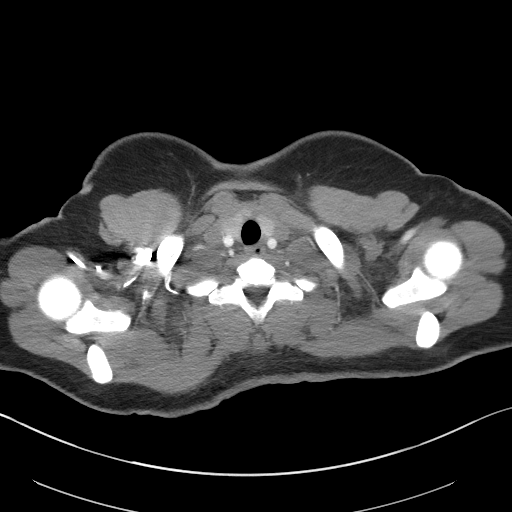
[im 86/89  lung]
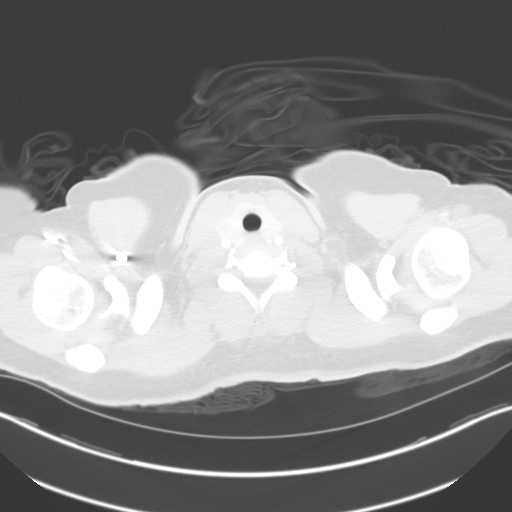

[19 of 46 positions shown; findings below may reference images not displayed]

FINDINGS: Cardiovascular: Thoracic aorta shows a normal branching pattern. No
aneurysmal dilatation is seen. The heart is at the upper limits of
normal in size. The pulmonary artery shows a normal branching
pattern. No definitive pulmonary embolus is noted.

Mediastinum/Nodes: The esophagus as visualized is within normal
limits. No hilar or mediastinal adenopathy is noted. Thoracic inlet
demonstrates a small 8 mm hypodense nodule.

Lungs/Pleura: Mild bibasilar atelectatic changes are seen. No
sizable effusion or parenchymal nodule is noted. No pneumothorax is
seen.

Upper Abdomen: Visualized upper abdomen shows no acute abnormality.

Musculoskeletal: No acute bony abnormality is seen. Prominent
stromal tissue is noted within the breasts bilaterally in a
symmetrical fashion.

Review of the MIP images confirms the above findings.
IMPRESSION: No evidence of pulmonary emboli.

8 mm hypodense nodule within the right lobe of the thyroid.

Given the size of the lesion in the patient's age no further
follow-up is recommended.

Reference: Recommendations for f/u of Incidental Thyroid Nodules
(ITN) found on CT, MR, NM and Extrathyroidal US are based upon the
ACR white paper and Mueller 3-tiered system for managing ITNs:[HOSPITAL]. [DATE]): 143-50

## 2020-06-25 ENCOUNTER — Encounter (HOSPITAL_COMMUNITY): Payer: Self-pay | Admitting: *Deleted

## 2020-06-25 ENCOUNTER — Emergency Department (HOSPITAL_COMMUNITY)
Admission: EM | Admit: 2020-06-25 | Discharge: 2020-06-25 | Disposition: A | Discharge status: Home / Self Care | Payer: Medicaid Other | Attending: Emergency Medicine | Admitting: Emergency Medicine

## 2020-06-25 ENCOUNTER — Other Ambulatory Visit: Payer: Self-pay

## 2020-06-25 ENCOUNTER — Emergency Department (HOSPITAL_COMMUNITY): Payer: Medicaid Other

## 2020-06-25 DIAGNOSIS — K5900 Constipation, unspecified: Secondary | ICD-10-CM | POA: Diagnosis not present

## 2020-06-25 DIAGNOSIS — K8020 Calculus of gallbladder without cholecystitis without obstruction: Secondary | ICD-10-CM | POA: Diagnosis not present

## 2020-06-25 DIAGNOSIS — Z87891 Personal history of nicotine dependence: Secondary | ICD-10-CM | POA: Insufficient documentation

## 2020-06-25 DIAGNOSIS — R1013 Epigastric pain: Secondary | ICD-10-CM | POA: Diagnosis present

## 2020-06-25 LAB — COMPREHENSIVE METABOLIC PANEL
ALT: 25 U/L (ref 0–44)
AST: 46 U/L — ABNORMAL HIGH (ref 15–41)
Albumin: 3.7 g/dL (ref 3.5–5.0)
Alkaline Phosphatase: 54 U/L (ref 38–126)
Anion gap: 11 (ref 5–15)
BUN: 11 mg/dL (ref 6–20)
CO2: 22 mmol/L (ref 22–32)
Calcium: 9.3 mg/dL (ref 8.9–10.3)
Chloride: 105 mmol/L (ref 98–111)
Creatinine, Ser: 0.78 mg/dL (ref 0.44–1.00)
GFR, Estimated: >60 mL/min (ref >60)
Glucose, Bld: 106 mg/dL — ABNORMAL HIGH (ref 70–99)
Potassium: 3.3 mmol/L — ABNORMAL LOW (ref 3.5–5.1)
Sodium: 138 mmol/L (ref 135–145)
Total Bilirubin: 0.4 mg/dL (ref 0.3–1.2)
Total Protein: 7.3 g/dL (ref 6.5–8.1)

## 2020-06-25 LAB — CBC
HCT: 34.8 % — ABNORMAL LOW (ref 36.0–46.0)
Hemoglobin: 10.8 g/dL — ABNORMAL LOW (ref 12.0–15.0)
MCH: 26.4 pg (ref 26.0–34.0)
MCHC: 31 g/dL (ref 30.0–36.0)
MCV: 85.1 fL (ref 80.0–100.0)
Platelets: 385 10*3/uL (ref 150–400)
RBC: 4.09 MIL/uL (ref 3.87–5.11)
RDW: 14.6 % (ref 11.5–15.5)
WBC: 6.8 10*3/uL (ref 4.0–10.5)
nRBC: 0 % (ref 0.0–0.2)

## 2020-06-25 LAB — URINALYSIS, ROUTINE W REFLEX MICROSCOPIC
Bilirubin Urine: NEGATIVE
Glucose, UA: NEGATIVE mg/dL
Hgb urine dipstick: NEGATIVE
Ketones, ur: NEGATIVE mg/dL
Leukocytes,Ua: NEGATIVE
Nitrite: NEGATIVE
Protein, ur: NEGATIVE mg/dL
Specific Gravity, Urine: 1.024 (ref 1.005–1.030)
pH: 6 (ref 5.0–8.0)

## 2020-06-25 LAB — LIPASE, BLOOD: Lipase: 30 U/L (ref 11–51)

## 2020-06-25 LAB — I-STAT BETA HCG BLOOD, ED (MC, WL, AP ONLY): I-stat hCG, quantitative: <5 m[IU]/mL (ref <5)

## 2020-06-25 MED ORDER — ALUM & MAG HYDROXIDE-SIMETH 200-200-20 MG/5ML PO SUSP
30.0000 mL | Freq: Once | ORAL | Status: AC
  Administered 2020-06-25: 30 mL via ORAL
  Filled 2020-06-25: qty 30

## 2020-06-25 MED ORDER — LIDOCAINE VISCOUS HCL 2 % MT SOLN
15.0000 mL | Freq: Once | OROMUCOSAL | Status: AC
  Administered 2020-06-25: 15 mL via ORAL
  Filled 2020-06-25: qty 15

## 2020-06-25 MED ORDER — HYDROCODONE-ACETAMINOPHEN 5-325 MG PO TABS: 1.0000 | ORAL_TABLET | Freq: Four times a day (QID) | ORAL | 0 refills | Status: DC | PRN

## 2020-06-25 MED ORDER — OXYCODONE-ACETAMINOPHEN 5-325 MG PO TABS
1.0000 | ORAL_TABLET | Freq: Once | ORAL | Status: AC
  Administered 2020-06-25: 1 via ORAL
  Filled 2020-06-25: qty 1

## 2020-06-25 NOTE — ED Triage Notes (Signed)
Pt arrives via [facility] with c/o abdominal pain. RUQ pain on and off, worse aftter BM. LBM 2 days ago. Tender to palpation. No diarrhea or vomiting. 142/90, hr 94, sats 98% RA.

## 2020-06-25 NOTE — ED Triage Notes (Signed)
Midline upper abdominal pain, 10/10. Has had issues with constipation this week.

## 2020-06-25 NOTE — ED Provider Notes (Signed)
[facility] EMERGENCY DEPARTMENT Provider Note   CSN: [id number] Arrival date & time: 06/25/20  0116     History Chief Complaint  Patient presents with  . Abdominal Pain    Jacqueline Peters is a 19 y.o. female.  Patient to ED with her mother for evaluation of epigastric pain that started several hours prior to arrival. She describes sharp, intense, constant, nonradiating pain. No alleviating or aggravating factors. She states she feels like she has to have a bowel movement but can't pass anything. No increased belching. No fever, nausea, vomiting, urinary symptoms, cough, chest pain or SOB, however, she does report increased abdominal pain with deep breath. No previous abdominal surgeries.   The history is provided by the patient and a parent. No language interpreter was used.  Abdominal Pain Associated symptoms: constipation   Associated symptoms: no chest pain, no chills, no dysuria, no fever, no nausea, no shortness of breath, no vaginal discharge and no vomiting        Past Medical History:  Diagnosis Date  . Anemia     Patient Active Problem List   Diagnosis Date Noted  . Morbid obesity ([place]) 202 lbs 01/31/2020  . Ex-smoker--last Black & Mild 10/2019 01/31/2020  . Thrombocytosis 07/01/2019  . Iron deficiency anemia 07/01/2019  . Hematuria 07/01/2019  . Pleuritic pain 07/01/2019  . CHILDHOOD OBESITY 04/23/2010  . Menorrhagia with regular cycle 04/23/2010    History reviewed. No pertinent surgical history.   OB History   No obstetric history on file.     No family history on file.  Social History   Tobacco Use  . Smoking status: Former Smoker    Types: Cigars  . Smokeless tobacco: Never Used  Vaping Use  . Vaping Use: Never used  Substance Use Topics  . Alcohol use: Not Currently    Comment: last use 01/24/20   . Drug use: Not Currently    Types: Marijuana    Comment: last MJ 10/2019    Home Medications Prior to Admission medications    Medication Sig Start Date End Date Taking? Authorizing Provider  brompheniramine-pseudoephedrine-DM 30-2-10 MG/5ML syrup Take 5 mLs by mouth 4 (four) times daily as needed. Mix with 5 mL of viscous lidocaine for slow swish swish and swallow. 04/28/20   Sable Feil, PA-C  ferrous sulfate 325 (65 FE) MG EC tablet Take 1 tablet (325 mg total) by mouth 2 (two) times daily. 11/15/19   Lequita Asal, MD  ferrous sulfate 325 (65 FE) MG tablet Take 1 tablet (325 mg total) by mouth daily with breakfast. 03/02/20 04/01/20  Lequita Asal, MD  lidocaine (XYLOCAINE) 2 % solution Use as directed 5 mLs in the mouth or throat every 6 (six) hours as needed for mouth pain. 5 mL for swish and slow swallow. 04/28/20   Sable Feil, PA-C  norgestrel-ethinyl estradiol (CRYSELLE-28) 0.3-30 MG-MCG tablet Take 1 tablet by mouth daily. 01/31/20 01/31/20  Herbie Saxon, CNM    Allergies    Patient has no known allergies.  Review of Systems   Review of Systems  Constitutional: Negative for chills and fever.  HENT: Negative.   Respiratory: Negative.  Negative for shortness of breath.   Cardiovascular: Negative.  Negative for chest pain.  Gastrointestinal: Positive for abdominal pain and constipation. Negative for nausea and vomiting.  Genitourinary: Negative.  Negative for dysuria and vaginal discharge.  Musculoskeletal: Negative.  Negative for back pain and myalgias.  Skin: Negative.   Neurological:  Negative.  Negative for weakness.    Physical Exam Updated Vital Signs BP 117/78 (BP Location: Right Arm)   Pulse 65   Temp 98.3 F (36.8 C) (Oral)   Resp 20   SpO2 100%   Physical Exam Vitals and nursing note reviewed.  Constitutional:      Appearance: She is well-developed.  HENT:     Head: Normocephalic.  Cardiovascular:     Rate and Rhythm: Normal rate and regular rhythm.  Pulmonary:     Effort: Pulmonary effort is normal.     Breath sounds: Normal breath sounds.  Abdominal:      General: Bowel sounds are normal.     Palpations: Abdomen is soft.     Tenderness: There is abdominal tenderness in the right upper quadrant and epigastric area. There is no guarding or rebound.    Musculoskeletal:        General: Normal range of motion.     Cervical back: Normal range of motion and neck supple.  Skin:    General: Skin is warm and dry.     Findings: No rash.  Neurological:     Mental Status: She is alert.     Cranial Nerves: No cranial nerve deficit.     ED Results / Procedures / Treatments   Labs (all labs ordered are listed, but only abnormal results are displayed) Labs Reviewed  COMPREHENSIVE METABOLIC PANEL - Abnormal; Notable for the following components:      Result Value   Potassium 3.3 (*)    Glucose, Bld 106 (*)    AST 46 (*)    All other components within normal limits  CBC - Abnormal; Notable for the following components:   Hemoglobin 10.8 (*)    HCT 34.8 (*)    All other components within normal limits  URINALYSIS, ROUTINE W REFLEX MICROSCOPIC - Abnormal; Notable for the following components:   APPearance CLOUDY (*)    All other components within normal limits  LIPASE, BLOOD  I-STAT BETA HCG BLOOD, ED ([facility], [facility], [facility] ONLY)    EKG None  Radiology No results found.  Procedures Procedures (including critical care time)  Medications Ordered in ED Medications - No data to display  ED Course  I have reviewed the triage vital signs and the nursing notes.  Pertinent labs & imaging results that were available during my care of the patient were reviewed by me and considered in my medical decision making (see chart for details).    MDM Rules/Calculators/A&P                          Patient to ED with abdominal pain as detailed in the HPI.   Very well appearing and in NAD. VSS. Obese, with RUQ tenderness to exam. Labs essentially unremarkable. DDx: gall stones vs constipation vs reflux.   [place] ordered, GI cocktail provided. Will reassess.    US shows cholelithiasis without cholecystitis. High normal CBD calibur. No leukocytosis or fever. Not vomiting. Will attempt to control pain. If pain/tenderness continue, will request surgical consult.  Pain significantly improved with Percocet x 1. Re-exam shows minimal tenderness to RUQ. No indication for emergent surgical evaluation. Will refer for outpatient evaluation and management.   Final Clinical Impression(s) / ED Diagnoses Final diagnoses:  None   1. Cholelithiasis   Rx / DC Orders ED Discharge Orders    None       Charlann Lange, PA-C 06/25/20 [id number]  Ripley Fraise, MD 06/25/20 [phone]

## 2020-06-25 NOTE — Discharge Instructions (Addendum)
You can be discharged home and will need to follow up with [facility] for further outpatient treatment of your gall stones. Take Norco if you have severe pain. Avoid high fat or fried foods as this can worsen your pain associated with gall stones.   Recommend Miralax (found over-the-counter) to relieve constipation. Take one dose up to 3 times daily until bowels clear. Take at this dose for a maximum of 3 consecutive days. Follow up with your primary care doctor if symptoms persist.

## 2020-07-14 ENCOUNTER — Encounter: Payer: Self-pay | Admitting: Surgery

## 2020-07-14 ENCOUNTER — Ambulatory Visit: Payer: Self-pay | Admitting: Surgery

## 2020-07-14 DIAGNOSIS — K8010 Calculus of gallbladder with chronic cholecystitis without obstruction: Secondary | ICD-10-CM | POA: Insufficient documentation

## 2020-07-14 NOTE — H&P (Signed)
Jacqueline Peters Appointment: 07/14/2020 10:30 AM Location: [facility] Patient #: [id number] DOB: 20-May-2001 Undefined / Language: Cleophus Molt / Race: Refused to Report/Unreported Female  History of Present Illness Adin Hector MD; 07/14/2020 11:58 AM) The patient is a 19 year old female who presents for evaluation of gall stones. Note for "Gall stones": ` ` ` Patient sent for surgical consultation at the request of Ripley Fraise, MD  Chief Complaint: abdominal pain and probable gallbladder attack. ` ` The patient is a young obese female who had episode of upper abdominal pain then intensive catheterized and became unbearable. Went to emergency room at [facility]. Suspected gallbladder attack. Ultrasound showed gallstones without cholecystitis. Patient's pain improved with 1 dose of narcotic pain medication. Outpatient follow-up recommended  patient notes she's had intermittent attacks. Usually start in epigastric region. Some nausea. However the attacks last months with a worst. Throughout the. Denies any heartburn or reflux. She can walk several miles without difficulty. She has history of smoking tobacco but recently quit. No abdominal surgery. She's never been pregnant. She has some constipation, moving her bowels about every 23 days. She is on iron for anemia secondary to menorrhagia.  No personal nor family history of GI/colon cancer, inflammatory bowel disease, irritable bowel syndrome, allergy such as Celiac Sprue, dietary/dairy problems, colitis, ulcers nor gastritis. No recent sick contacts/gastroenteritis. No travel outside the country. No changes in diet. No dysphagia to solids or liquids. No significant heartburn or reflux. No melena, hematemesis, coffee ground emesis. No evidence of prior gastric/peptic ulceration.  (Review of systems as stated in this history (HPI) or in the review of systems. Otherwise all other 12 point ROS are negative) ` `  ###########################################`  This patient encounter took 25 minutes today to perform the following: obtain history, perform exam, review outside records, interpret tests & imaging, counsel the patient on their diagnosis; and, document this encounter including findings & plan in the electronic health record (EHR).   Past Surgical History Illene Regulus, CMA; 07/14/2020 11:06 AM) Oral Surgery  Diagnostic Studies History Lars Mage Spillers, CMA; 07/14/2020 11:06 AM) Colonoscopy never Mammogram never Pap Smear never  Allergies (Alisha Spillers, CMA; 07/14/2020 11:07 AM) No Known Drug Allergies [07/14/2020]:  Medication History (Alisha Spillers, CMA; 07/14/2020 11:07 AM) Ferrous Sulfate (325 (65 Fe)MG Tablet, Oral) Active. Medications Reconciled  Social History Illene Regulus, CMA; 07/14/2020 11:06 AM) Alcohol use Occasional alcohol use. No caffeine use No drug use Tobacco use Current some day smoker.  Family History Illene Regulus, [place]; 07/14/2020 11:06 AM) Hypertension Father.  Pregnancy / Birth History Illene Regulus, CMA; 07/14/2020 11:06 AM) Age at menarche 39 years. Gravida 0 Para 0 Regular periods  Other Problems Lars Mage Spillers, CMA; 07/14/2020 11:06 AM) Other disease, cancer, significant illness     Review of Systems (Alisha Spillers CMA; 07/14/2020 11:06 AM) General Not Present- Appetite Loss, Chills, Fatigue, Fever, Night Sweats, Weight Gain and Weight Loss. Skin Not Present- Change in Wart/Mole, Dryness, Hives, Jaundice, New Lesions, Non-Healing Wounds, Rash and Ulcer. HEENT Not Present- Earache, Hearing Loss, Hoarseness, Nose Bleed, Oral Ulcers, Ringing in the Ears, Seasonal Allergies, Sinus Pain, Sore Throat, Visual Disturbances, Wears glasses/contact lenses and Yellow Eyes. Respiratory Not Present- Bloody sputum, Chronic Cough, Difficulty Breathing, Snoring and Wheezing. Breast Not Present- Breast Mass, Breast Pain, Nipple  Discharge and Skin Changes. Cardiovascular Not Present- Chest Pain, Difficulty Breathing Lying Down, Leg Cramps, Palpitations, Rapid Heart Rate, Shortness of Breath and Swelling of Extremities. Gastrointestinal Not Present- Abdominal Pain, Bloating, Bloody Stool, Change in Bowel  Habits, Chronic diarrhea, Constipation, Difficulty Swallowing, Excessive gas, Gets full quickly at meals, Hemorrhoids, Indigestion, Nausea, Rectal Pain and Vomiting. Female Genitourinary Not Present- Frequency, Nocturia, Painful Urination, Pelvic Pain and Urgency. Musculoskeletal Not Present- Back Pain, Joint Pain, Joint Stiffness, Muscle Pain, Muscle Weakness and Swelling of Extremities. Neurological Not Present- Decreased Memory, Fainting, Headaches, Numbness, Seizures, Tingling, Tremor, Trouble walking and Weakness. Psychiatric Not Present- Anxiety, Bipolar, Change in Sleep Pattern, Depression, Fearful and Frequent crying. Endocrine Not Present- Cold Intolerance, Excessive Hunger, Hair Changes, Heat Intolerance, Hot flashes and New Diabetes. Hematology Not Present- Blood Thinners, Easy Bruising, Excessive bleeding, Gland problems, HIV and Persistent Infections.  Vitals (Alisha Spillers CMA; 07/14/2020 11:07 AM) 07/14/2020 11:07 AM Weight: 227.2 lb Height: 64in Body Surface Area: 2.06 m Body Mass Index: 39 kg/m  Pulse: 73 (Regular)  BP: 100/64(Sitting, Left Arm, Standard)        Physical Exam Adin Hector MD; 07/14/2020 11:57 AM)  General Mental Status-Alert. General Appearance-Not in acute distress, Not Sickly. Orientation-Oriented X3. Hydration-Well hydrated. Voice-Normal.  Integumentary Global Assessment Upon inspection and palpation of skin surfaces of the - Axillae: non-tender, no inflammation or ulceration, no drainage. and Distribution of scalp and body hair is normal. General Characteristics Temperature - normal warmth is noted.  Head and Neck Head-normocephalic,  atraumatic with no lesions or palpable masses. Face Global Assessment - atraumatic, no absence of expression. Neck Global Assessment - no abnormal movements, no bruit auscultated on the right, no bruit auscultated on the left, no decreased range of motion, non-tender. Trachea-midline. Thyroid Gland Characteristics - non-tender.  Eye Eyeball - Left-Extraocular movements intact, No Nystagmus - Left. Eyeball - Right-Extraocular movements intact, No Nystagmus - Right. Cornea - Left-No Hazy - Left. Cornea - Right-No Hazy - Right. Sclera/Conjunctiva - Left-No scleral icterus, No Discharge - Left. Sclera/Conjunctiva - Right-No scleral icterus, No Discharge - Right. Pupil - Left-Direct reaction to light normal. Pupil - Right-Direct reaction to light normal.  ENMT Ears Pinna - Left - no drainage observed, no generalized tenderness observed. Pinna - Right - no drainage observed, no generalized tenderness observed. Nose and Sinuses External Inspection of the Nose - no destructive lesion observed. Inspection of the nares - Left - quiet respiration. Inspection of the nares - Right - quiet respiration. Mouth and Throat Lips - Upper Lip - no fissures observed, no pallor noted. Lower Lip - no fissures observed, no pallor noted. Nasopharynx - no discharge present. Oral Cavity/Oropharynx - Tongue - no dryness observed. Oral Mucosa - no cyanosis observed. Hypopharynx - no evidence of airway distress observed.  Chest and Lung Exam Inspection Movements - Normal and Symmetrical. Accessory muscles - No use of accessory muscles in breathing. Palpation Palpation of the chest reveals - Non-tender. Auscultation Breath sounds - Normal and Clear.  Cardiovascular Auscultation Rhythm - Regular. Murmurs & Other Heart Sounds - Auscultation of the heart reveals - No Murmurs and No Systolic Clicks.  Abdomen Inspection Inspection of the abdomen reveals - No Visible peristalsis and No Abnormal  pulsations. Umbilicus - No Bleeding, No Urine drainage. Palpation/Percussion Palpation and Percussion of the abdomen reveal - Soft, Non Tender, No Rebound tenderness, No Rigidity (guarding) and No Cutaneous hyperesthesia. Note: Abdomen obese but soft. Not severely distended. No diastasis recti. No umbilical or other anterior abdominal wall hernias  Female Genitourinary Sexual Maturity Tanner 5 - Adult hair pattern. Note: No vaginal bleeding nor discharge  Peripheral Vascular Upper Extremity Inspection - Left - No Cyanotic nailbeds - Left, Not Ischemic. Inspection - Right -  No Cyanotic nailbeds - Right, Not Ischemic.  Neurologic Neurologic evaluation reveals -normal attention span and ability to concentrate, able to name objects and repeat phrases. Appropriate fund of knowledge , normal sensation and normal coordination. Mental Status Affect - not angry, not paranoid. Cranial Nerves-Normal Bilaterally. Gait-Normal.  Neuropsychiatric Mental status exam performed with findings of-able to articulate well with normal speech/language, rate, volume and coherence, thought content normal with ability to perform basic computations and apply abstract reasoning and no evidence of hallucinations, delusions, obsessions or homicidal/suicidal ideation.  Musculoskeletal Global Assessment Spine, Ribs and Pelvis - no instability, subluxation or laxity. Right Upper Extremity - no instability, subluxation or laxity.  Lymphatic Head & Neck  General Head & Neck Lymphatics: Bilateral - Description - No Localized lymphadenopathy. Axillary  General Axillary Region: Bilateral - Description - No Localized lymphadenopathy. Femoral & Inguinal  Generalized Femoral & Inguinal Lymphatics: Left - Description - No Localized lymphadenopathy. Right - Description - No Localized lymphadenopathy.    Assessment & Plan Adin Hector MD; 07/14/2020 11:55 AM)  CHRONIC CHOLECYSTITIS WITH CALCULUS  (K80.10) Impression: rather classic story of biliary colic with known stones. The rest of the differential diagnoses seems underwhelming.  I think she would benefit from cholecystectomy. Try single site technique. May need for port given her obesity. She is interested in proceeding.  Quit smoking - she notes she is just started to stop tobacco. I encouraged her to continue avoiding  Current Plans You are being scheduled for surgery- Our schedulers will call you.  You should hear from our office's scheduling department within 5 working days about the location, date, and time of surgery. We try to make accommodations for patient's preferences in scheduling surgery, but sometimes the OR schedule or the surgeon's schedule prevents [place] from making those accommodations.  If you have not heard from our office [phone]) in 5 working days, call the office and ask for your surgeon's nurse.  If you have other questions about your diagnosis, plan, or surgery, call the office and ask for your surgeon's nurse.  Written instructions provided Pt Education - Pamphlet Given - Laparoscopic Gallbladder Surgery: discussed with patient and provided information. The anatomy & physiology of hepatobiliary & pancreatic function was discussed. The pathophysiology of gallbladder dysfunction was discussed. Natural history risks without surgery was discussed. I feel the risks of no intervention will lead to serious problems that outweigh the operative risks; therefore, I recommended cholecystectomy to remove the pathology. I explained laparoscopic techniques with possible need for an open approach. Probable cholangiogram to evaluate the bilary tract was explained as well.  Risks such as bleeding, infection, abscess, leak, injury to other organs, need for further treatment, heart attack, death, and other risks were discussed. I noted a good likelihood this will help address the problem. Possibility that this  will not correct all abdominal symptoms was explained. Goals of post-operative recovery were discussed as well. We will work to minimize complications. An educational handout further explaining the pathology and treatment options was given as well. Questions were answered. The patient expresses understanding & wishes to proceed with surgery.  Pt Education - [facility] Laparosopic Post Op HCI ([facility]) Pt Education - [facility] Good Bowel Health (Navia Lindahl) Pt Education - Laparoscopic Cholecystectomy: gallbladder  TOBACCO ABUSE (Z72.0)  Current Plans Pt Education - [facility] STOP SMOKING!  BODY MASS INDEX [BMI] 30.0-30.9, ADULT (Z68.30)  Adin Hector, MD, FACS, MASCRS Gastrointestinal and Minimally Invasive Surgery  [facility] [address] [place], [place]  [address] [phone] Fax [phone] Main/Paging  CONTACT INFORMATION: Weekday (9AM-5PM) concerns: Call [facility] main office at [phone] Weeknight (5PM-9AM) or Weekend/Holiday concerns: Check [email or web address] for General Surgery [facility] coverage (Please, do not use SecureChat as it is not reliable communication to operating surgeons for immediate patient care)

## 2020-08-31 ENCOUNTER — Inpatient Hospital Stay: Payer: Medicaid Other

## 2020-08-31 ENCOUNTER — Telehealth: Payer: Self-pay | Admitting: Hematology and Oncology

## 2020-08-31 NOTE — Telephone Encounter (Signed)
08/31/2020 Called pt and informed her that she missed her lab appt today, and would not be able to keep her appts for MD/infusion on 09/01/20. Made new appts for 09/28/20 for labs and 09/30/20 for MD/infusion. Pt confirmed these changes  SRW

## 2020-08-31 NOTE — Progress Notes (Incomplete)
[facility]  [address] [place], [place] [address] Phone: [phone]  Fax: [phone]   Office Visit:  08/31/20  Referring physician: No ref. provider found   Chief Complaint: Jacqueline Peters is a 20 y.o. female with iron deficiency anemia and thrombocytosis who is seen for 6 month assessment.   HPI: The patient was last seen in the hematology clinic on 03/02/2020. At that time, she has felt "good".  Menses has been not been heavy.  She ran out of oral iron 1 month ago. Hematocrit was 34.0, hemoglobin 11.3, MCV 81.5, platelets 413,000, WBC 7,100. Ferritin was 6. She was to restart oral iron.  During the interim, ***   Past Medical History:  Diagnosis Date  . Anemia     No past surgical history on file.  No family history on file.  Social History:  reports that she has quit smoking. Her smoking use included cigars. She has never used smokeless tobacco. She reports previous alcohol use. She reports previous drug use. Drug: Marijuana. The patient smokes black cigars twice a day for the past 3 months. The patient is alone*** today.  Allergies: No Known Allergies  Current Medications: Current Outpatient Medications  Medication Sig Dispense Refill  . brompheniramine-pseudoephedrine-DM 30-2-10 MG/5ML syrup Take 5 mLs by mouth 4 (four) times daily as needed. Mix with 5 mL of viscous lidocaine for slow swish swish and swallow. 120 mL 0  . ferrous sulfate 325 (65 FE) MG EC tablet Take 1 tablet (325 mg total) by mouth 2 (two) times daily. 60 tablet 3  . ferrous sulfate 325 (65 FE) MG tablet Take 1 tablet (325 mg total) by mouth daily with breakfast. 60 tablet 1  . HYDROcodone-acetaminophen (NORCO/VICODIN) 5-325 MG tablet Take 1 tablet by mouth every 6 (six) hours as needed for severe pain. 8 tablet 0  . lidocaine (XYLOCAINE) 2 % solution Use as directed 5 mLs in the mouth or throat every 6 (six) hours as needed for mouth pain. 5 mL for swish and slow  swallow. 100 mL 0   No current facility-administered medications for this visit.    Review of Systems  Constitutional: Negative.  Negative for chills, diaphoresis, fever, malaise/fatigue and weight loss.       Feels "good."  HENT: Negative for congestion, ear discharge, ear pain, hearing loss, nosebleeds, sinus pain, sore throat and tinnitus.   Eyes: Negative.  Negative for blurred vision and double vision.  Respiratory: Negative for cough, hemoptysis, sputum production, shortness of breath and wheezing.   Cardiovascular: Negative.  Negative for chest pain, palpitations, orthopnea, leg swelling and PND.  Gastrointestinal: Negative for abdominal pain, blood in stool, constipation, diarrhea, heartburn, melena, nausea and vomiting.       Normal bowels. Diet stable. No ice pica.  Genitourinary: Negative for dysuria, flank pain, frequency, hematuria and urgency.       Prior h/o heavy menses (use 16 pads per cycle). Recent menses regular.  Musculoskeletal: Negative.  Negative for back pain, joint pain, myalgias and neck pain.  Skin: Negative.  Negative for itching and rash.  Neurological: Negative.  Negative for dizziness, tingling, sensory change, weakness and headaches.  Endo/Heme/Allergies: Does not bruise/bleed easily.  Psychiatric/Behavioral: Negative.  Negative for depression and memory loss. The patient is not nervous/anxious and does not have insomnia.   All other systems reviewed and are negative.  Performance status (ECOG): 0***  Vital Signs There were no vitals taken for this visit.  Physical Exam Vitals and nursing  note reviewed.  Constitutional:      General: She is not in acute distress.    Appearance: She is well-developed. She is not diaphoretic.  HENT:     Head: Normocephalic and atraumatic.     Mouth/Throat:     Mouth: Mucous membranes are moist.  Eyes:     General: No scleral icterus.    Conjunctiva/sclera: Conjunctivae normal.     Pupils: Pupils are equal, round,  and reactive to light.     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 Brown eyes.  Cardiovascular:     Rate and Rhythm: Normal rate and regular rhythm.     Pulses: Normal pulses.     Heart sounds: Normal heart sounds.  Pulmonary:     Effort: Pulmonary effort is normal.     Breath sounds: Normal breath sounds.  Chest:  Breasts:     Right: No axillary adenopathy or supraclavicular adenopathy.     Left: No axillary adenopathy or supraclavicular adenopathy.    Abdominal:     General: Abdomen is flat. Bowel sounds are normal. There is no distension.     Palpations: Abdomen is soft. There is no hepatomegaly, splenomegaly or mass.  Musculoskeletal:        General: No swelling or tenderness. Normal range of motion.     Cervical back: Normal range of motion and neck supple.  Lymphadenopathy:     Head:     Right side of head: No preauricular, posterior auricular or occipital adenopathy.     Left side of head: No preauricular or posterior auricular adenopathy.     Cervical: No cervical adenopathy.     Upper Body:     Right upper body: No supraclavicular or axillary adenopathy.     Left upper body: No supraclavicular or axillary adenopathy.     Lower Body: No right inguinal adenopathy. No left inguinal adenopathy.  Skin:    General: Skin is warm and dry.  Neurological:     Mental Status: She is alert and oriented to person, place, and time. Mental status is at baseline.     Gait: Gait normal.  Psychiatric:        Behavior: Behavior normal.        Thought Content: Thought content normal.        Judgment: Judgment normal.    No visits with results within 3 Day(s) from this visit.  Latest known visit with results is:  Admission on 06/25/2020, Discharged on 06/25/2020  Component Date Value Ref Range Status  . Lipase 06/25/2020 30  11 - 51 U/L Final   Performed at [facility], [place] [address], [place], [place] [address]  . Sodium 06/25/2020 138  135  - 145 mmol/L Final  . Potassium 06/25/2020 3.3* 3.5 - 5.1 mmol/L Final  . Chloride 06/25/2020 105  98 - 111 mmol/L Final  . CO2 06/25/2020 22  22 - 32 mmol/L Final  . Glucose, Bld 06/25/2020 106* 70 - 99 mg/dL Final   Glucose reference range applies only to samples taken after fasting for at least 8 hours.  . BUN 06/25/2020 11  6 - 20 mg/dL Final  . Creatinine, Ser 06/25/2020 0.78  0.44 - 1.00 mg/dL Final  . Calcium 06/25/2020 9.3  8.9 - 10.3 mg/dL Final  . Total Protein 06/25/2020 7.3  6.5 - 8.1 g/dL Final  . Albumin 06/25/2020 3.7  3.5 - 5.0 g/dL Final  . AST 06/25/2020 46* 15 - 41 U/L  Final  . ALT 06/25/2020 25  0 - 44 U/L Final  . Alkaline Phosphatase 06/25/2020 54  38 - 126 U/L Final  . Total Bilirubin 06/25/2020 0.4  0.3 - 1.2 mg/dL Final  . GFR, Estimated 06/25/2020 >60  >60 mL/min Final   Comment: (NOTE) Calculated using the CKD-EPI Creatinine Equation (2021)   . Anion gap 06/25/2020 11  5 - 15 Final   Performed at [facility], [place] [address] [place], [place] [address]  . WBC 06/25/2020 6.8  4.0 - 10.5 K/uL Final  . RBC 06/25/2020 4.09  3.87 - 5.11 MIL/uL Final  . Hemoglobin 06/25/2020 10.8* 12.0 - 15.0 g/dL Final  . HCT 06/25/2020 34.8* 36.0 - 46.0 % Final  . MCV 06/25/2020 85.1  80.0 - 100.0 fL Final  . MCH 06/25/2020 26.4  26.0 - 34.0 pg Final  . MCHC 06/25/2020 31.0  30.0 - 36.0 g/dL Final  . RDW 06/25/2020 14.6  11.5 - 15.5 % Final  . Platelets 06/25/2020 385  150 - 400 K/uL Final  . nRBC 06/25/2020 0.0  0.0 - 0.2 % Final   Performed at [facility] [address] [place], [place] [address]  . Color, Urine 06/25/2020 YELLOW  YELLOW Final  . APPearance 06/25/2020 CLOUDY* CLEAR Final  . Specific Gravity, Urine 06/25/2020 1.024  1.005 - 1.030 Final  . pH 06/25/2020 6.0  5.0 - 8.0 Final  . Glucose, UA 06/25/2020 NEGATIVE  NEGATIVE mg/dL Final  . Hgb urine dipstick 06/25/2020 NEGATIVE  NEGATIVE Final  . Bilirubin Urine 06/25/2020 NEGATIVE  NEGATIVE Final   . Ketones, ur 06/25/2020 NEGATIVE  NEGATIVE mg/dL Final  . Protein, ur 06/25/2020 NEGATIVE  NEGATIVE mg/dL Final  . Nitrite 06/25/2020 NEGATIVE  NEGATIVE Final  . Chalmers Guest 06/25/2020 NEGATIVE  NEGATIVE Final   Performed at [facility], [place] [address] [place], [place] [address]  . I-stat hCG, quantitative 06/25/2020 <5.0  <5 mIU/mL Final  . Comment 3 06/25/2020          Final   Comment:   GEST. AGE      CONC.  (mIU/mL)   <=1 WEEK        5 - 50     2 WEEKS       50 - 500     3 WEEKS       100 - 10,000     4 WEEKS     1,000 - 30,000        FEMALE AND NON-PREGNANT FEMALE:     LESS THAN 5 mIU/mL     Assessment:  Jacqueline Peters is a 20 y.o. female with iron deficiency anemia and reactive thrombocytosis.  Diet is poor.  She has menorrhagia.  She notes intermittent hematuria.  She began ferrous sulfate 1 tablet/day on 06/27/2019.  She has a 1 week history of right lower lateral chest pain/rib pain without trauma.  Pain is somewhat pleuritic in nature and is worse with position changes.  She denies any shortness of breath or hemoptysis.  She denies any trauma. Oxygen saturation is 100%.  CXR and rib films on 06/27/2019 were normal.  Labs on 06/27/2019 revealed a hematocrit 25.4, hemoglobin 7.5, MCV 66.3, platelets 979,000, WBC 11,900.  Ferritin was 5 with an iron saturation of 3% with a TIBC of 372.  Total protein was 8.2.  Lipase was 23.   Ferritin has been followed: 5 on 06/27/2019, 9 on 11/05/2019, 5 on 11/14/2019, and 6 on 02/26/2020.  Peripheral smear revealed a microcytic hypochromic iron deficiency anemia. There was mild leukocytosis with unremarkable WBC morphology. There was thrombocytosis with unremarkable platelet morphology. Findings consistent with chronic iron deficiency and secondary reactive thrombocytosis.  Work up on 07/01/2019 revealed a hematocrit 27.4, hemoglobin 8.0, MCV 68.3, platelets 929,000, WBC 10,800 (ANC 8,000).  Retic was 1.9%.  PT was 13.4 with an INR  of 1.0.  PTT was 35.  Von Willebrand panel included a factor VIII of 219%, ristocetin co-factor 38% (50-200%), and von Willebrand antigen 87%.   Urinalysis revealed large amounts of hemoglobin, small amounts of bilirubin, trace amounts of protein, 11-20 RBCs per HPF and 0-5 WBCs.  There were few bacteria.  Labs on 07/17/2019 were negative for BCR-ABL, JAK2 V617F, exon 12-15, CALR, and MPL.  Urinalysis revealed no hematuria.  D-dimer was 7401.92 (0-499) on 07/01/2019.  Chest CT angiogram on 07/01/2019 showed no evidence of pulmonary emboli. There was a 8 mm hypodense nodule within the right lobe of the thyroid. Given the size of the lesion in the patient's age no further follow was recommended.  She has a history of bloody right nipple discharge.  Right breast ultrasound on 05/03/2019 revealed right retroareolar anterior depth corresponding to the site of blood noted on the patient's nipple (1-2 o'clock) was negative. There was no ductal dilation. There was no intraductal mass. There was no suspicious mass or associated feature. Clinical follow up was recommended.   Shas not received the COVID-19 vaccine.  Symptomatically, ***  Plan: 1.   Review labs   2.   Iron deficiency anemia             Hematocrit 25.4.  Hemoglobin 7.5.  MCV 66.3 on 06/27/2019.                         Ferritin 5 with an iron saturation of 3% and a TIBC of 372.             Hematocrit 35.0.  Hemoglobin 11.1.  MCV 81.8 on 11/14/2019.   Ferritin 5.  Hematocrit 34.0.  Hemoglobin 11.3.  MCV 81.5 on 02/26/2020.   Ferritin 6.  Patient denies hematuria or heavy menses.             Restart oral iron 1 tablet a day with orange juice or vitamin C.   Increase dose to 1 pill twice a day if tolerated.  Rx: ferrous sulfate 325 mg p.o. daily (dispense #60 with 1 refill). 3.   Thrombocytosis             Platelet count 479,000 on 11/14/2019.  Platelet count 413,000 on 02/26/2020 (improved).  Etiology felt reactive and due to iron  deficiency anemia.  BCR-ABL, JAK2 V617F, exon 12-15, CALR, and MPL negative on 07/17/2019.             Continue to monitor. 4.   Menorrhagia             Patient denies any recent heavy menses.  She notes prior history of minimal use of ibuprofen and no use of aspirin.             PT and PTT were normal.  Von Willebrand panel revealed ristocetin co-factor 38% (low) with normal von Willebrand factor VIII and von Willebrand antigen.   Low ristocetin co-factor significance unclear- ? vWF gene polymorphism.   Anticipate repeat testing.  Continue to monitor. 6.   No Venofer today. 7.   RTC in 3 months  labs (CBC, ferritin). 8.   RTC in 6 months for MD assessment, labs (CBC with diff, ferritin, iron studies-day before) and +/- Venofer.  I discussed the assessment and treatment plan with the patient.  The patient was provided an opportunity to ask questions and all were answered.  The patient agreed with the plan and demonstrated an understanding of the instructions.  The patient was advised to call back if the symptoms worsen or if the condition fails to improve as anticipated.  I provided *** minutes of face-to-face time during this this encounter and > 50% was spent counseling as documented under my assessment and plan.  Melissa C. Mike Gip, MD, PhD    03/02/2020, 1:23 PM  I, De Burrs, am acting as scribe for [employer]. Mike Gip, MD, PhD.  I, Melissa C. Mike Gip, MD, have reviewed the above documentation for accuracy and completeness, and I agree with the above.

## 2020-09-01 ENCOUNTER — Inpatient Hospital Stay: Payer: Medicaid Other

## 2020-09-01 ENCOUNTER — Inpatient Hospital Stay: Payer: Medicaid Other | Attending: Hematology and Oncology | Admitting: Hematology and Oncology

## 2020-09-28 ENCOUNTER — Inpatient Hospital Stay: Payer: Medicaid Other | Attending: Hematology and Oncology

## 2020-09-30 ENCOUNTER — Inpatient Hospital Stay: Payer: Medicaid Other | Admitting: Hematology and Oncology

## 2020-09-30 ENCOUNTER — Inpatient Hospital Stay: Payer: Medicaid Other

## 2020-09-30 NOTE — Progress Notes (Deleted)
[facility]  [address] [place], [place] [address] Phone: [phone]  Fax: [phone]   Office Visit:  09/30/20  Referring physician: No ref. provider found   Chief Complaint: Jacqueline Peters is a 20 y.o. female with iron deficiency anemia and thrombocytosis who is seen for 7 month assessment.   HPI: The patient was last seen in the hematology clinic on 03/02/2020. At that time, she has felt "good".  Menses has been not been heavy.  She ran out of oral iron 1 month prior. Hematocrit was 34.0, hemoglobin 11.3, MCV 81.5, platelets 413,000, WBC 7,100. Ferritin was 6. She was to restart oral iron.  During the interim, ***   Past Medical History:  Diagnosis Date  . Anemia     No past surgical history on file.  No family history on file.  Social History:  reports that she has quit smoking. Her smoking use included cigars. She has never used smokeless tobacco. She reports previous alcohol use. She reports previous drug use. Drug: Marijuana. The patient smokes black cigars twice a day for the past 3 months. The patient is alone*** today.  Allergies: No Known Allergies  Current Medications: Current Outpatient Medications  Medication Sig Dispense Refill  . brompheniramine-pseudoephedrine-DM 30-2-10 MG/5ML syrup Take 5 mLs by mouth 4 (four) times daily as needed. Mix with 5 mL of viscous lidocaine for slow swish swish and swallow. 120 mL 0  . ferrous sulfate 325 (65 FE) MG EC tablet Take 1 tablet (325 mg total) by mouth 2 (two) times daily. 60 tablet 3  . ferrous sulfate 325 (65 FE) MG tablet Take 1 tablet (325 mg total) by mouth daily with breakfast. 60 tablet 1  . HYDROcodone-acetaminophen (NORCO/VICODIN) 5-325 MG tablet Take 1 tablet by mouth every 6 (six) hours as needed for severe pain. 8 tablet 0  . lidocaine (XYLOCAINE) 2 % solution Use as directed 5 mLs in the mouth or throat every 6 (six) hours as needed for mouth pain. 5 mL for swish and slow  swallow. 100 mL 0   No current facility-administered medications for this visit.    Review of Systems  Constitutional: Negative.  Negative for chills, diaphoresis, fever, malaise/fatigue and weight loss.       Feels "good."  HENT: Negative for congestion, ear discharge, ear pain, hearing loss, nosebleeds, sinus pain, sore throat and tinnitus.   Eyes: Negative.  Negative for blurred vision and double vision.  Respiratory: Negative for cough, hemoptysis, sputum production, shortness of breath and wheezing.   Cardiovascular: Negative.  Negative for chest pain, palpitations, orthopnea, leg swelling and PND.  Gastrointestinal: Negative for abdominal pain, blood in stool, constipation, diarrhea, heartburn, melena, nausea and vomiting.       Normal bowels. Diet stable. No ice pica.  Genitourinary: Negative for dysuria, flank pain, frequency, hematuria and urgency.       Prior h/o heavy menses (use 16 pads per cycle). Recent menses regular.  Musculoskeletal: Negative.  Negative for back pain, joint pain, myalgias and neck pain.  Skin: Negative.  Negative for itching and rash.  Neurological: Negative.  Negative for dizziness, tingling, sensory change, weakness and headaches.  Endo/Heme/Allergies: Does not bruise/bleed easily.  Psychiatric/Behavioral: Negative.  Negative for depression and memory loss. The patient is not nervous/anxious and does not have insomnia.   All other systems reviewed and are negative.  Performance status (ECOG): 0***  Vital Signs There were no vitals taken for this visit.  Physical Exam Vitals and nursing  note reviewed.  Constitutional:      General: She is not in acute distress.    Appearance: She is well-developed. She is not diaphoretic.  HENT:     Head: Normocephalic and atraumatic.     Mouth/Throat:     Mouth: Mucous membranes are moist.  Eyes:     General: No scleral icterus.    Conjunctiva/sclera: Conjunctivae normal.     Pupils: Pupils are equal, round,  and reactive to light.     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 Brown eyes.  Cardiovascular:     Rate and Rhythm: Normal rate and regular rhythm.     Pulses: Normal pulses.     Heart sounds: Normal heart sounds.  Pulmonary:     Effort: Pulmonary effort is normal.     Breath sounds: Normal breath sounds.  Chest:  Breasts:     Right: No axillary adenopathy or supraclavicular adenopathy.     Left: No axillary adenopathy or supraclavicular adenopathy.    Abdominal:     General: Abdomen is flat. Bowel sounds are normal. There is no distension.     Palpations: Abdomen is soft. There is no hepatomegaly, splenomegaly or mass.  Musculoskeletal:        General: No swelling or tenderness. Normal range of motion.     Cervical back: Normal range of motion and neck supple.  Lymphadenopathy:     Head:     Right side of head: No preauricular, posterior auricular or occipital adenopathy.     Left side of head: No preauricular or posterior auricular adenopathy.     Cervical: No cervical adenopathy.     Upper Body:     Right upper body: No supraclavicular or axillary adenopathy.     Left upper body: No supraclavicular or axillary adenopathy.     Lower Body: No right inguinal adenopathy. No left inguinal adenopathy.  Skin:    General: Skin is warm and dry.  Neurological:     Mental Status: She is alert and oriented to person, place, and time. Mental status is at baseline.     Gait: Gait normal.  Psychiatric:        Behavior: Behavior normal.        Thought Content: Thought content normal.        Judgment: Judgment normal.    No visits with results within 3 Day(s) from this visit.  Latest known visit with results is:  Admission on 06/25/2020, Discharged on 06/25/2020  Component Date Value Ref Range Status  . Lipase 06/25/2020 30  11 - 51 U/L Final   Performed at [facility], [place] [address], [place], [place] [address]  . Sodium 06/25/2020 138  135  - 145 mmol/L Final  . Potassium 06/25/2020 3.3* 3.5 - 5.1 mmol/L Final  . Chloride 06/25/2020 105  98 - 111 mmol/L Final  . CO2 06/25/2020 22  22 - 32 mmol/L Final  . Glucose, Bld 06/25/2020 106* 70 - 99 mg/dL Final   Glucose reference range applies only to samples taken after fasting for at least 8 hours.  . BUN 06/25/2020 11  6 - 20 mg/dL Final  . Creatinine, Ser 06/25/2020 0.78  0.44 - 1.00 mg/dL Final  . Calcium 06/25/2020 9.3  8.9 - 10.3 mg/dL Final  . Total Protein 06/25/2020 7.3  6.5 - 8.1 g/dL Final  . Albumin 06/25/2020 3.7  3.5 - 5.0 g/dL Final  . AST 06/25/2020 46* 15 - 41 U/L  Final  . ALT 06/25/2020 25  0 - 44 U/L Final  . Alkaline Phosphatase 06/25/2020 54  38 - 126 U/L Final  . Total Bilirubin 06/25/2020 0.4  0.3 - 1.2 mg/dL Final  . GFR, Estimated 06/25/2020 >60  >60 mL/min Final   Comment: (NOTE) Calculated using the CKD-EPI Creatinine Equation (2021)   . Anion gap 06/25/2020 11  5 - 15 Final   Performed at [facility], [place] [address] [place], [place] [address]  . WBC 06/25/2020 6.8  4.0 - 10.5 K/uL Final  . RBC 06/25/2020 4.09  3.87 - 5.11 MIL/uL Final  . Hemoglobin 06/25/2020 10.8* 12.0 - 15.0 g/dL Final  . HCT 06/25/2020 34.8* 36.0 - 46.0 % Final  . MCV 06/25/2020 85.1  80.0 - 100.0 fL Final  . MCH 06/25/2020 26.4  26.0 - 34.0 pg Final  . MCHC 06/25/2020 31.0  30.0 - 36.0 g/dL Final  . RDW 06/25/2020 14.6  11.5 - 15.5 % Final  . Platelets 06/25/2020 385  150 - 400 K/uL Final  . nRBC 06/25/2020 0.0  0.0 - 0.2 % Final   Performed at [facility] [address] [place], [place] [address]  . Color, Urine 06/25/2020 YELLOW  YELLOW Final  . APPearance 06/25/2020 CLOUDY* CLEAR Final  . Specific Gravity, Urine 06/25/2020 1.024  1.005 - 1.030 Final  . pH 06/25/2020 6.0  5.0 - 8.0 Final  . Glucose, UA 06/25/2020 NEGATIVE  NEGATIVE mg/dL Final  . Hgb urine dipstick 06/25/2020 NEGATIVE  NEGATIVE Final  . Bilirubin Urine 06/25/2020 NEGATIVE  NEGATIVE Final   . Ketones, ur 06/25/2020 NEGATIVE  NEGATIVE mg/dL Final  . Protein, ur 06/25/2020 NEGATIVE  NEGATIVE mg/dL Final  . Nitrite 06/25/2020 NEGATIVE  NEGATIVE Final  . Chalmers Guest 06/25/2020 NEGATIVE  NEGATIVE Final   Performed at [facility], [place] [address] [place], [place] [address]  . I-stat hCG, quantitative 06/25/2020 <5.0  <5 mIU/mL Final  . Comment 3 06/25/2020          Final   Comment:   GEST. AGE      CONC.  (mIU/mL)   <=1 WEEK        5 - 50     2 WEEKS       50 - 500     3 WEEKS       100 - 10,000     4 WEEKS     1,000 - 30,000        FEMALE AND NON-PREGNANT FEMALE:     LESS THAN 5 mIU/mL     Assessment:  Jacqueline Peters is a 20 y.o. female with iron deficiency anemia and reactive thrombocytosis.  Diet is poor.  She has menorrhagia.  She notes intermittent hematuria.  She began ferrous sulfate 1 tablet/day on 06/27/2019.  She has a 1 week history of right lower lateral chest pain/rib pain without trauma.  Pain is somewhat pleuritic in nature and is worse with position changes.  She denies any shortness of breath or hemoptysis.  She denies any trauma. Oxygen saturation is 100%.  CXR and rib films on 06/27/2019 were normal.  Labs on 06/27/2019 revealed a hematocrit 25.4, hemoglobin 7.5, MCV 66.3, platelets 979,000, WBC 11,900.  Ferritin was 5 with an iron saturation of 3% with a TIBC of 372.  Total protein was 8.2.  Lipase was 23.   Ferritin has been followed: 5 on 06/27/2019, 9 on 11/05/2019, 5 on 11/14/2019, and 6 on 02/26/2020.  Peripheral smear revealed a microcytic hypochromic iron deficiency anemia. There was mild leukocytosis with unremarkable WBC morphology. There was thrombocytosis with unremarkable platelet morphology. Findings consistent with chronic iron deficiency and secondary reactive thrombocytosis.  Work up on 07/01/2019 revealed a hematocrit 27.4, hemoglobin 8.0, MCV 68.3, platelets 929,000, WBC 10,800 (ANC 8,000).  Retic was 1.9%.  PT was 13.4 with an INR  of 1.0.  PTT was 35.  Von Willebrand panel included a factor VIII of 219%, ristocetin co-factor 38% (50-200%), and von Willebrand antigen 87%.   Urinalysis revealed large amounts of hemoglobin, small amounts of bilirubin, trace amounts of protein, 11-20 RBCs per HPF and 0-5 WBCs.  There were few bacteria.  Labs on 07/17/2019 were negative for BCR-ABL, JAK2 V617F, exon 12-15, CALR, and MPL.  Urinalysis revealed no hematuria.  D-dimer was 7401.92 (0-499) on 07/01/2019.  Chest CT angiogram on 07/01/2019 showed no evidence of pulmonary emboli. There was a 8 mm hypodense nodule within the right lobe of the thyroid. Given the size of the lesion in the patient's age no further follow was recommended.  She has a history of bloody right nipple discharge.  Right breast ultrasound on 05/03/2019 revealed right retroareolar anterior depth corresponding to the site of blood noted on the patient's nipple (1-2 o'clock) was negative. There was no ductal dilation. There was no intraductal mass. There was no suspicious mass or associated feature. Clinical follow up was recommended.   Shas not received the COVID-19 vaccine.  Symptomatically, ***  Plan: 1.   Labs today:  CBC with diff, ferritin, iron studies.   2.   Iron deficiency anemia             Hematocrit 25.4.  Hemoglobin 7.5.  MCV 66.3 on 06/27/2019.                         Ferritin 5 with an iron saturation of 3% and a TIBC of 372.             Hematocrit 35.0.  Hemoglobin 11.1.  MCV 81.8 on 11/14/2019.   Ferritin 5.  Hematocrit 34.0.  Hemoglobin 11.3.  MCV 81.5 on 02/26/2020.   Ferritin 6.  Patient denies hematuria or heavy menses.             Restart oral iron 1 tablet a day with orange juice or vitamin C.   Increase dose to 1 pill twice a day if tolerated.  Rx: ferrous sulfate 325 mg p.o. daily (dispense #60 with 1 refill). 3.   Thrombocytosis             Platelet count 479,000 on 11/14/2019.  Platelet count 413,000 on 02/26/2020  (improved).  Etiology felt reactive and due to iron deficiency anemia.  BCR-ABL, JAK2 V617F, exon 12-15, CALR, and MPL negative on 07/17/2019.             Continue to monitor. 4.   Menorrhagia             Patient denies any recent heavy menses.  She notes prior history of minimal use of ibuprofen and no use of aspirin.             PT and PTT were normal.  Von Willebrand panel revealed ristocetin co-factor 38% (low) with normal von Willebrand factor VIII and von Willebrand antigen.   Low ristocetin co-factor significance unclear- ? vWF gene polymorphism.   Anticipate repeat testing.  Continue to monitor. 6.   No Venofer today.  7.   RTC in 3 months labs (CBC, ferritin). 8.   RTC in 6 months for MD assessment, labs (CBC with diff, ferritin, iron studies-day before) and +/- Venofer.  I discussed the assessment and treatment plan with the patient.  The patient was provided an opportunity to ask questions and all were answered.  The patient agreed with the plan and demonstrated an understanding of the instructions.  The patient was advised to call back if the symptoms worsen or if the condition fails to improve as anticipated.  I provided *** minutes of face-to-face time during this this encounter and > 50% was spent counseling as documented under my assessment and plan.   Melissa C. Mike Gip, MD, PhD    09/30/2020, 4:49 AM  I, De Burrs, am acting as scribe for [employer]. Mike Gip, MD, PhD.  I, Melissa C. Mike Gip, MD, have reviewed the above documentation for accuracy and completeness, and I agree with the above.

## 2020-12-29 ENCOUNTER — Encounter: Payer: Medicaid Other | Admitting: Obstetrics & Gynecology

## 2021-02-02 ENCOUNTER — Other Ambulatory Visit (HOSPITAL_COMMUNITY)
Admission: RE | Admit: 2021-02-02 | Discharge: 2021-02-02 | Disposition: A | Discharge status: Home / Self Care | Payer: Medicaid Other | Source: Ambulatory Visit | Attending: Obstetrics & Gynecology | Admitting: Obstetrics & Gynecology

## 2021-02-02 ENCOUNTER — Encounter: Payer: Self-pay | Admitting: Obstetrics & Gynecology

## 2021-02-02 ENCOUNTER — Ambulatory Visit (INDEPENDENT_AMBULATORY_CARE_PROVIDER_SITE_OTHER): Payer: Medicaid Other | Admitting: Obstetrics & Gynecology

## 2021-02-02 ENCOUNTER — Other Ambulatory Visit: Payer: Self-pay

## 2021-02-02 VITALS — BP 116/75 | HR 75 | Ht 64.0 in | Wt 228.4 lb

## 2021-02-02 DIAGNOSIS — Z01419 Encounter for gynecological examination (general) (routine) without abnormal findings: Secondary | ICD-10-CM | POA: Insufficient documentation

## 2021-02-02 DIAGNOSIS — Z23 Encounter for immunization: Secondary | ICD-10-CM

## 2021-02-02 DIAGNOSIS — Z113 Encounter for screening for infections with a predominantly sexual mode of transmission: Secondary | ICD-10-CM

## 2021-02-02 DIAGNOSIS — Z3009 Encounter for other general counseling and advice on contraception: Secondary | ICD-10-CM | POA: Diagnosis not present

## 2021-02-02 NOTE — Patient Instructions (Addendum)
Preventive Care 21-22 Years Old, Female Preventive care refers to lifestyle choices and visits with your health care provider that can promote health and wellness. This includes: A yearly physical exam. This is also called an annual wellness visit. Regular dental and eye exams. Immunizations. Screening for certain conditions. Healthy lifestyle choices, such as: Eating a healthy diet. Getting regular exercise. Not using drugs or products that contain nicotine and tobacco. Limiting alcohol use. What can I expect for my preventive care visit? Physical exam Your health care provider may check your: Height and weight. These may be used to calculate your BMI (body mass index). BMI is a measurement that tells if you are at a healthy weight. Heart rate and blood pressure. Body temperature. Skin for abnormal spots. Counseling Your health care provider may ask you questions about your: Past medical problems. Family's medical history. Alcohol, tobacco, and drug use. Emotional well-being. Home life and relationship well-being. Sexual activity. Diet, exercise, and sleep habits. Work and work environment. Access to firearms. Method of birth control. Menstrual cycle. Pregnancy history. What immunizations do I need?  Vaccines are usually given at various ages, according to a schedule. Your health care provider will recommend vaccines for you based on your age, medicalhistory, and lifestyle or other factors, such as travel or where you work. What tests do I need?  Blood tests Lipid and cholesterol levels. These may be checked every 5 years starting at age 20. Hepatitis C test. Hepatitis B test. Screening Diabetes screening. This is done by checking your blood sugar (glucose) after you have not eaten for a while (fasting). STD (sexually transmitted disease) testing, if you are at risk. BRCA-related cancer screening. This may be done if you have a family history of breast, ovarian, tubal, or  peritoneal cancers. Pelvic exam and Pap test. This may be done every 3 years starting at age 21. Starting at age 30, this may be done every 5 years if you have a Pap test in combination with an HPV test. Talk with your health care provider about your test results, treatment options,and if necessary, the need for more tests. Follow these instructions at home: Eating and drinking  Eat a healthy diet that includes fresh fruits and vegetables, whole grains, lean protein, and low-fat dairy products. Take vitamin and mineral supplements as recommended by your health care provider. Do not drink alcohol if: Your health care provider tells you not to drink. You are pregnant, may be pregnant, or are planning to become pregnant. If you drink alcohol: Limit how much you have to 0-1 drink a day. Be aware of how much alcohol is in your drink. In the [place] one drink equals one 12 oz bottle of beer (355 mL), one 5 oz glass of wine (148 mL), or one 1 oz glass of hard liquor (44 mL).  Lifestyle Take daily care of your teeth and gums. Brush your teeth every morning and night with fluoride toothpaste. Floss one time each day. Stay active. Exercise for at least 30 minutes 5 or more days each week. Do not use any products that contain nicotine or tobacco, such as cigarettes, e-cigarettes, and chewing tobacco. If you need help quitting, ask your health care provider. Do not use drugs. If you are sexually active, practice safe sex. Use a condom or other form of protection to prevent STIs (sexually transmitted infections). If you do not wish to become pregnant, use a form of birth control. If you plan to become pregnant, see your health care   provider for a prepregnancy visit. Find healthy ways to cope with stress, such as: Meditation, yoga, or listening to music. Journaling. Talking to a trusted person. Spending time with friends and family. Safety Always wear your seat belt while driving or riding in a  vehicle. Do not drive: If you have been drinking alcohol. Do not ride with someone who has been drinking. When you are tired or distracted. While texting. Wear a helmet and other protective equipment during sports activities. If you have firearms in your house, make sure you follow all gun safety procedures. Seek help if you have been physically or sexually abused. What's next? Go to your health care provider once a year for an annual wellness visit. Ask your health care provider how often you should have your eyes and teeth checked. Stay up to date on all vaccines. This information is not intended to replace advice given to you by your health care provider. Make sure you discuss any questions you have with your healthcare provider. Document Revised: 03/22/2020 Document Reviewed: 04/05/2018 Elsevier Patient Education  2022 [place].   HPV (Human Papillomavirus) Vaccine: What You Need to Know 1. Why get vaccinated? HPV (human papillomavirus) vaccine can prevent infection with some types of human papillomavirus. HPV infections can cause certain types of cancers, including: cervical, vaginal, and vulvar cancers in women penile cancer in men anal cancers in both men and women cancers of tonsils, base of tongue, and back of throat (oropharyngeal cancer) in both men and women HPV infections can also cause anogenital warts. HPV vaccine can prevent over 90% of cancers caused by HPV. HPV is spread through intimate skin-to-skin or sexual contact. HPV infections are so common that nearly all people will get at least one type of HPV at some time in their lives. Most HPV infections go away on their own within 2 years. But sometimes HPV infections will last longer and can cause cancers later inlife. 2. HPV vaccine HPV vaccine is routinely recommended for adolescents at 47 or 22 years of age to ensure they are protected before they are exposed to the virus. HPV vaccine may be given beginning at  age 80 years and vaccination is recommended foreveryone through 22 years of age. HPV vaccine may be given to adults 68 through 22 years of age, based ondiscussions between the patient and health care provider. Most children who get the first dose before 46 years of age need 2 doses of HPV vaccine. People who get the first dose at or after 66 years of age and younger people with certain immunocompromising conditions need 3 doses. Your healthcare provider can give you more information. HPV vaccine may be given at the same time as other vaccines. 3. Talk with your health care provider Tell your vaccination provider if the person getting the vaccine: Has had an allergic reaction after a previous dose of HPV vaccine, or has any severe, life-threatening allergies Is pregnant--HPV vaccine is not recommended until after pregnancy In some cases, your health care provider may decide to postpone HPV vaccinationuntil a future visit. People with minor illnesses, such as a cold, may be vaccinated. People who are moderately or severely ill should usually wait until they recover beforegetting HPV vaccine. Your health care provider can give you more information. 4. Risks of a vaccine reaction Soreness, redness, or swelling where the shot is given can happen after HPV vaccination. Fever or headache can happen after HPV vaccination. People sometimes faint after medical procedures, including vaccination. Tellyour provider if  you feel dizzy or have vision changes or ringing in the ears. As with any medicine, there is a very remote chance of a vaccine causing asevere allergic reaction, other serious injury, or death. 5. What if there is a serious problem? An allergic reaction could occur after the vaccinated person leaves the clinic. If you see signs of a severe allergic reaction (hives, swelling of the face and throat, difficulty breathing, a fast heartbeat, dizziness, or weakness), call 9-1-1 and get the person to the  nearest hospital. For other signs that concern you, call your health care provider. Adverse reactions should be reported to the Vaccine Adverse Event Reporting System (VAERS). Your health care provider will usually file this report, or you can do it yourself. Visit the VAERS website at www.vaers.SamedayNews.es or call (873)478-8632. VAERS is only for reporting reactions, and VAERS staff members do not give medical advice. 6. The National Vaccine Injury Compensation Program The [employer] Vaccine Injury Compensation Program (VICP) is a federal program that was created to compensate people who may have been injured by certain vaccines. Claims regarding alleged injury or death due to vaccination have a time limit for filing, which may be as short as two years. Visit the VICP website at GoldCloset.com.ee or call 986 385 3466 to learn about the program and about filing a claim. 7. How can I learn more? Ask your health care provider. Call your local or state health department. Visit the website of the Food and Drug Administration (FDA) for vaccine package inserts and additional information at TraderRating.uy. Contact the Centers for Disease Control and Prevention (CDC): Call 902-153-1403 (1-800-CDC-INFO) or Visit CDC's website at http://hunter.com/. Vaccine Information Statement HPV Vaccine (03/13/2020) This information is not intended to replace advice given to you by your health care provider. Make sure you discuss any questions you have with your healthcare provider. Document Revised: 04/21/2020 Document Reviewed: 04/21/2020 Elsevier Patient Education  2022 [employer].  Contraception Choices Contraception, also called birth control, refers to methods or devices thatprevent pregnancy. Hormonal methods  Contraceptive implant A contraceptive implant is a thin, plastic tube that contains a hormone that prevents pregnancy. It is different from an intrauterine  device (IUD). It is inserted into the upper part of the arm by a health care provider. Implants canbe effective for up to 3 years. Progestin-only injections Progestin-only injections are injections of progestin, a synthetic form of thehormone progesterone. They are given every 3 months by a health care provider. Birth control pills Birth control pills are pills that contain hormones that prevent pregnancy. They must be taken once a day, preferably at the same time each day. Aprescription is needed to use this method of contraception. Birth control patch The birth control patch contains hormones that prevent pregnancy. It is placed on the skin and must be changed once a week for three weeks and removed on thefourth week. A prescription is needed to use this method of contraception. Vaginal ring A vaginal ring contains hormones that prevent pregnancy. It is placed in the vagina for three weeks and removed on the fourth week. After that, the process is repeated with a new ring. A prescription is needed to use this method ofcontraception. Emergency contraceptive Emergency contraceptives prevent pregnancy after unprotected sex. They come in pill form and can be taken up to 5 days after sex. They work best the sooner they are taken after having sex. Most emergency contraceptives are available without a prescription. This method should not be used as your only form ofbirth control. Barrier  methods  Female condom A female condom is a thin sheath that is worn over the penis during sex. Condoms keep sperm from going inside a woman's body. They can be used with a sperm-killing substance (spermicide) to increase their effectiveness. They should be thrown away after one use. Female condom A female condom is a soft, loose-fitting sheath that is put into the vagina before sex. The condom keeps sperm from going inside a woman's body. Theyshould be thrown away after one use. Diaphragm A diaphragm is a soft, dome-shaped  barrier. It is inserted into the vagina before sex, along with a spermicide. The diaphragm blocks sperm from entering the uterus, and the spermicide kills sperm. A diaphragm should be left in thevagina for 6-8 hours after sex and removed within 24 hours. A diaphragm is prescribed and fitted by a health care provider. A diaphragm should be replaced every 1-2 years, after giving birth, after gaining more than15 lb (6.8 kg), and after pelvic surgery. Cervical cap A cervical cap is a round, soft latex or plastic cup that fits over the cervix. It is inserted into the vagina before sex, along with spermicide. It blocks sperm from entering the uterus. The cap should be left in place for 6-8 hours after sex and removed within 48 hours. A cervical cap must be prescribed andfitted by a health care provider. It should be replaced every 2 years. Sponge A sponge is a soft, circular piece of polyurethane foam with spermicide in it. The sponge helps block sperm from entering the uterus, and the spermicide kills sperm. To use it, you make it wet and then insert it into the vagina. It should be inserted before sex, left in for at least 6 hours after sex, and removed andthrown away within 30 hours. Spermicides Spermicides are chemicals that kill or block sperm from entering the cervix and uterus. They can come as a cream, jelly, suppository, foam, or tablet. A spermicide should be inserted into the vagina with an applicator at least 40-98 minutes before sex to allow time for it to work. The process must be repeatedevery time you have sex. Spermicides do not require a prescription. Intrauterine contraception Intrauterine device (IUD) An IUD is a T-shaped device that is put in a woman's uterus. There are two types: Hormone IUD.This type contains progestin, a synthetic form of the hormone progesterone. This type can stay in place for 3-5 years. Copper IUD.This type is wrapped in copper wire. It can stay in place for 10  years. Permanent methods of contraception Female tubal ligation In this method, a woman's fallopian tubes are sealed, tied, or blocked duringsurgery to prevent eggs from traveling to the uterus. Hysteroscopic sterilization In this method, a small, flexible insert is placed into each fallopian tube. The inserts cause scar tissue to form in the fallopian tubes and block them, so sperm cannot reach an egg. The procedure takes about 3 months to be effective.Another form of birth control must be used during those 3 months. Female sterilization This is a procedure to tie off the tubes that carry sperm (vasectomy). After the procedure, the man can still ejaculate fluid (semen). Another form of birth control must be used for 3 months after the procedure. Natural planning methods Natural family planning In this method, a couple does not have sex on days when the woman could become pregnant. Calendar method In this method, the woman keeps track of the length of each menstrual cycle, identifies the days when pregnancy can happen, and does  not have sex on those days. Ovulation method In this method, a couple avoids sex during ovulation. Symptothermal method This method involves not having sex during ovulation. The woman typically checks for ovulation bywatching changes in her temperature and in the consistency of cervical mucus. Post-ovulation method In this method, a couple waits to have sex until after ovulation. Where to find more information Centers for Disease Control and Prevention: http://www.wolf.info/ Summary Contraception, also called birth control, refers to methods or devices that prevent pregnancy. Hormonal methods of contraception include implants, injections, pills, patches, vaginal rings, and emergency contraceptives. Barrier methods of contraception can include female condoms, female condoms, diaphragms, cervical caps, sponges, and spermicides. There are two types of IUDs (intrauterine devices). An  IUD can be put in a woman's uterus to prevent pregnancy for 3-5 years. Permanent sterilization can be done through a procedure for males and females. Natural family planning methods involve nothaving sex on days when the woman could become pregnant. This information is not intended to replace advice given to you by your health care provider. Make sure you discuss any questions you have with your healthcare provider. Document Revised: 12/30/2019 Document Reviewed: 12/30/2019 Elsevier Patient Education  [place].

## 2021-02-02 NOTE — Progress Notes (Signed)
GYNECOLOGY ANNUAL PREVENTATIVE CARE ENCOUNTER NOTE  History:     Jacqueline Peters is a 20 y.o. G0P0000 female here for a routine annual gynecologic exam.  Current complaints: none.  Sexually active, not on any contraception, reports being in a monogamous relationship.  Desires STI screen.  Denies abnormal vaginal bleeding, discharge, pelvic pain, problems with intercourse or other gynecologic concerns.    Gynecologic History Patient's last menstrual period was 01/20/2021 (exact date). Contraception: coitus interruptus Has not completed HPV vaccine series, one dose seen in 04/23/2010.  Obstetric History OB History  Gravida Para Term Preterm AB Living  0 0 0 0 0 0  SAB IAB Ectopic Multiple Live Births  0 0 0 0 0    Past Medical History:  Diagnosis Date   Anemia    Wisdom teeth extracted     History reviewed. No pertinent surgical history.  Current Outpatient Medications on File Prior to Visit  Medication Sig Dispense Refill   ferrous sulfate 325 (65 FE) MG EC tablet Take 1 tablet (325 mg total) by mouth 2 (two) times daily. (Patient not taking: Reported on 02/02/2021) 60 tablet 3   HYDROcodone-acetaminophen (NORCO/VICODIN) 5-325 MG tablet Take 1 tablet by mouth every 6 (six) hours as needed for severe pain. (Patient not taking: Reported on 02/02/2021) 8 tablet 0   [DISCONTINUED] norgestrel-ethinyl estradiol (CRYSELLE-28) 0.3-30 MG-MCG tablet Take 1 tablet by mouth daily. 28 tablet 13   No current facility-administered medications on file prior to visit.    No Known Allergies  Social History:  reports that she has quit smoking. Her smoking use included cigars. She has never used smokeless tobacco. She reports previous alcohol use. She reports previous drug use. Drug: Marijuana.  History reviewed. No pertinent family history.  The following portions of the patient's history were reviewed and updated as appropriate: allergies, current medications, past family history, past medical  history, past social history, past surgical history and problem list.  Review of Systems Pertinent items noted in HPI and remainder of comprehensive ROS otherwise negative.  Physical Exam:  BP 116/75   Pulse 75   Ht '5\' 4"'  (1.626 m)   Wt 228 lb 6.4 oz (103.6 kg)   LMP 01/20/2021 (Exact Date)   BMI 39.20 kg/m  CONSTITUTIONAL: Well-developed, well-nourished female in no acute distress.  HENT:  Normocephalic, atraumatic, External right and left ear normal.  EYES: Conjunctivae and EOM are normal. Pupils are equal, round, and reactive to light. No scleral icterus.  NECK: Normal range of motion, supple, no masses.  Normal thyroid.  SKIN: Skin is warm and dry. No rash noted. Not diaphoretic. No erythema. No pallor. MUSCULOSKELETAL: Normal range of motion. No tenderness.  No cyanosis, clubbing, or edema. NEUROLOGIC: Alert and oriented to person, place, and time. Normal reflexes, muscle tone coordination.  PSYCHIATRIC: Normal mood and affect. Normal behavior. Normal judgment and thought content. CARDIOVASCULAR: Normal heart rate noted, regular rhythm RESPIRATORY: Clear to auscultation bilaterally. Effort and breath sounds normal, no problems with respiration noted. BREASTS: Symmetric in size. No masses, tenderness, skin changes, nipple drainage, or lymphadenopathy bilaterally. Performed in the presence of a chaperone. ABDOMEN: Soft, no distention noted.  No tenderness, rebound or guarding.  PELVIC: Normal appearing external genitalia and urethral meatus; normal appearing vaginal mucosa and cervix.  No abnormal discharge noted.  Pap smear obtained.  Normal uterine size, no other palpable masses, no uterine or adnexal tenderness.  Performed in the presence of a chaperone.   Assessment and Plan:  1. Need for HPV vaccine Counseled about this, emphasized importance of completing 3 dose regimen. First dose given today, will return in 2 and 6 months for other doses. - HPV 9-valent  vaccine,Recombinat  2. Need for diphtheria-tetanus-pertussis (Tdap) vaccine Care gap met with this vaccine administration. - Tdap vaccine greater than or equal to 7yo IM  3. General counselling and advice on contraception Discussed and recommended contraception as she does not desire pregnancy, coitus interruptus is not a reliable method.  Reviewed all forms of birth control options available including abstinence; fertility period awareness methods; over the counter/barrier methods; hormonal contraceptive medication including pill, patch, ring, injection,contraceptive implant; hormonal and nonhormonal IUDs. Risks and benefits reviewed.  Questions were answered.  Information was given to patient to review.  She wants fertility awareness method for now, recommended addition of condoms to prevent STIs.  4. Routine screening for STI (sexually transmitted infection) Safe sex recommended. - Cytology - PAP - Hepatitis B surface antigen - Hepatitis C antibody - HIV Antibody (routine testing w rflx) - RPR  5. Women's annual routine gynecological examination - Cytology - PAP Will follow up results of pap smear and manage accordingly. Routine preventative health maintenance measures emphasized. Please refer to After Visit Summary for other counseling recommendations.      Verita Schneiders, MD, [place] for [employer], [place]

## 2021-02-03 LAB — HEPATITIS B SURFACE ANTIGEN: Hepatitis B Surface Ag: NEGATIVE

## 2021-02-03 LAB — RPR: RPR Ser Ql: NONREACTIVE

## 2021-02-03 LAB — HEPATITIS C ANTIBODY: Hep C Virus Ab: <0.1 s/co ratio (ref 0.0–0.9)

## 2021-02-03 LAB — HIV ANTIBODY (ROUTINE TESTING W REFLEX): HIV Screen 4th Generation wRfx: NONREACTIVE

## 2021-02-04 LAB — CYTOLOGY - PAP
Chlamydia: NEGATIVE
Comment: NEGATIVE
Comment: NEGATIVE
Comment: NORMAL
Diagnosis: NEGATIVE
Neisseria Gonorrhea: NEGATIVE
Trichomonas: NEGATIVE

## 2021-02-16 ENCOUNTER — Ambulatory Visit: Payer: Medicaid Other

## 2021-02-17 ENCOUNTER — Ambulatory Visit: Payer: Medicaid Other

## 2021-03-30 ENCOUNTER — Ambulatory Visit: Payer: Medicaid Other

## 2021-04-01 ENCOUNTER — Other Ambulatory Visit: Payer: Self-pay

## 2021-04-01 ENCOUNTER — Ambulatory Visit (INDEPENDENT_AMBULATORY_CARE_PROVIDER_SITE_OTHER): Payer: Medicaid Other | Admitting: *Deleted

## 2021-04-01 DIAGNOSIS — Z23 Encounter for immunization: Secondary | ICD-10-CM

## 2021-04-01 DIAGNOSIS — R300 Dysuria: Secondary | ICD-10-CM

## 2021-04-01 LAB — POCT URINALYSIS DIPSTICK
Bilirubin, UA: NEGATIVE
Blood, UA: NEGATIVE
Glucose, UA: NEGATIVE
Ketones, UA: NEGATIVE
Leukocytes, UA: NEGATIVE
Nitrite, UA: NEGATIVE
Protein, UA: POSITIVE — AB
Spec Grav, UA: 1.01 (ref 1.010–1.025)
Urobilinogen, UA: 0.2 E.U./dL
pH, UA: 7 (ref 5.0–8.0)

## 2021-04-01 NOTE — Progress Notes (Signed)
2nd Gardasil given LD without difficulty   SUBJECTIVE: Jacqueline Peters is a 20 y.o. female who complains of dysuria x 1 day, without flank pain, chills, or abnormal vaginal discharge or bleeding.   OBJECTIVE: Appears well, in no apparent distress.  Vital signs are normal. Urine dipstick shows positive for protein.    ASSESSMENT: Dysuria  PLAN: Treatment per orders.  Call or return to clinic prn if these symptoms worsen or fail to improve as anticipated.

## 2021-04-02 NOTE — Progress Notes (Signed)
Patient seen and assessed by nursing staff.  Agree with documentation and plan.  

## 2021-05-04 ENCOUNTER — Ambulatory Visit: Payer: Medicaid Other

## 2021-05-17 ENCOUNTER — Telehealth: Payer: Self-pay

## 2021-05-17 NOTE — Telephone Encounter (Signed)
Left a message for this pt to call back to schedule a self swab

## 2021-05-18 ENCOUNTER — Other Ambulatory Visit: Payer: Self-pay

## 2021-05-18 ENCOUNTER — Other Ambulatory Visit (HOSPITAL_COMMUNITY)
Admission: RE | Admit: 2021-05-18 | Discharge: 2021-05-18 | Disposition: A | Discharge status: Home / Self Care | Payer: Medicaid Other | Source: Ambulatory Visit | Attending: Family Medicine | Admitting: Family Medicine

## 2021-05-18 ENCOUNTER — Ambulatory Visit: Payer: Medicaid Other

## 2021-05-18 VITALS — BP 128/85 | HR 86 | Wt 221.0 lb

## 2021-05-18 DIAGNOSIS — N898 Other specified noninflammatory disorders of vagina: Secondary | ICD-10-CM | POA: Diagnosis not present

## 2021-05-18 NOTE — Progress Notes (Signed)
Patient seen and assessed by nursing staff.  Agree with documentation and plan.  

## 2021-05-18 NOTE — Progress Notes (Signed)
SUBJECTIVE:  20 y.o. female complains of thin vaginal discharge and odor. Denies abnormal vaginal bleeding or significant pelvic pain or fever. No UTI symptoms. Denies history of known exposure to STD.  No LMP recorded.  OBJECTIVE:  She appears well, afebrile. Urine dipstick: not done.  ASSESSMENT:  Vaginal Discharge  Vaginal Odor   PLAN:  GC, chlamydia, trichomonas, BVAG, CVAG probe sent to lab. Treatment: To be determined once lab results are received ROV prn if symptoms persist or worsen.

## 2021-05-20 LAB — CERVICOVAGINAL ANCILLARY ONLY
Bacterial Vaginitis (gardnerella): POSITIVE — AB
Candida Glabrata: NEGATIVE
Candida Vaginitis: NEGATIVE
Chlamydia: NEGATIVE
Comment: NEGATIVE
Comment: NEGATIVE
Comment: NEGATIVE
Comment: NEGATIVE
Comment: NEGATIVE
Comment: NORMAL
Neisseria Gonorrhea: NEGATIVE
Trichomonas: NEGATIVE

## 2021-05-20 MED ORDER — METRONIDAZOLE 500 MG PO TABS: 500.0000 mg | ORAL_TABLET | Freq: Two times a day (BID) | ORAL | 0 refills | Status: DC

## 2021-05-20 NOTE — Addendum Note (Signed)
Addended by: Donnamae Jude on: 05/20/2021 05:26 PM   Modules accepted: Orders

## 2021-06-09 IMAGING — US US ABDOMEN LIMITED
1 series · 14 of 25 positions shown · non-contrast
Comparison: None.

CLINICAL DATA: Right upper quadrant tenderness

EXAM:
ULTRASOUND ABDOMEN LIMITED RIGHT UPPER QUADRANT

[Series 1: us abdomen limited · 14 of 29 slices shown]
[im 1/29]
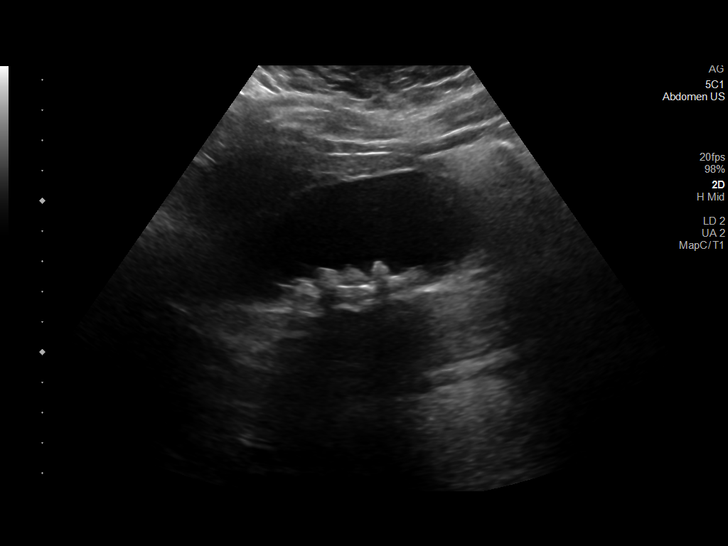
[im 3/29]
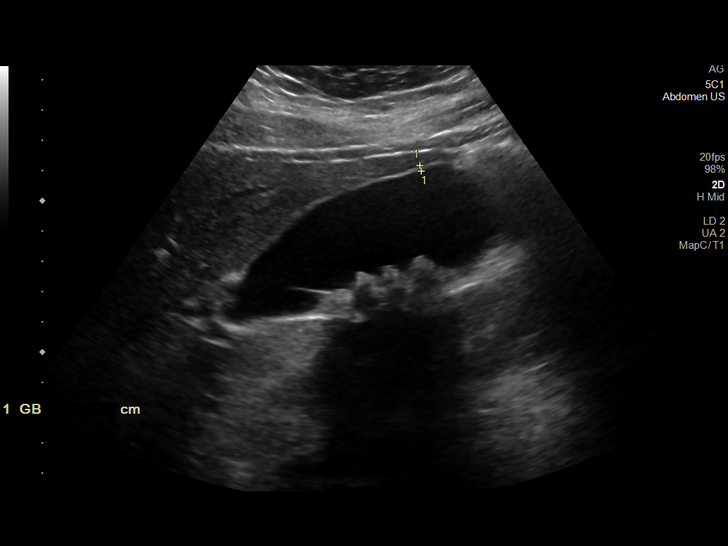
[im 5/29]
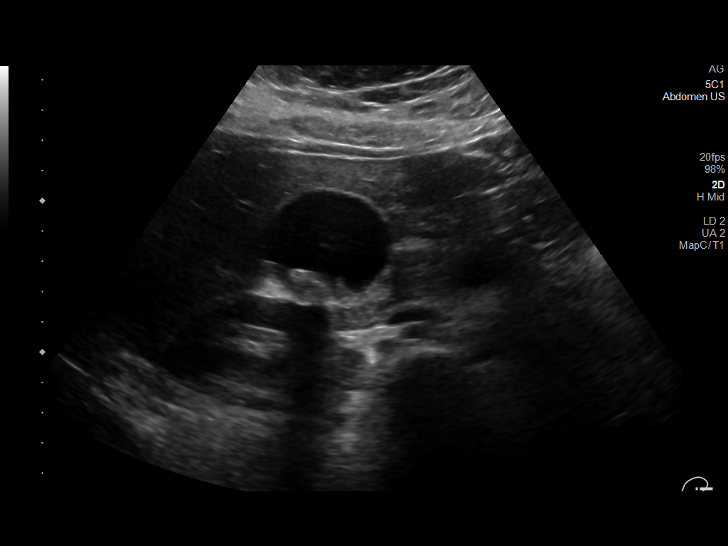
[im 8/29]
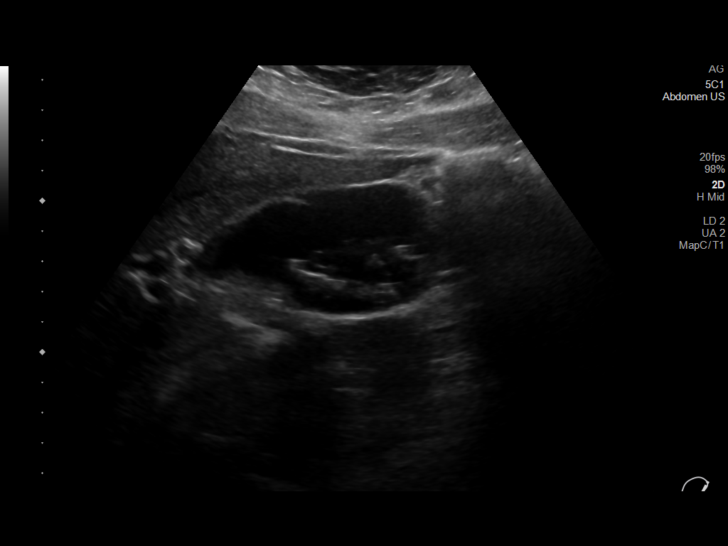
[im 10/29]
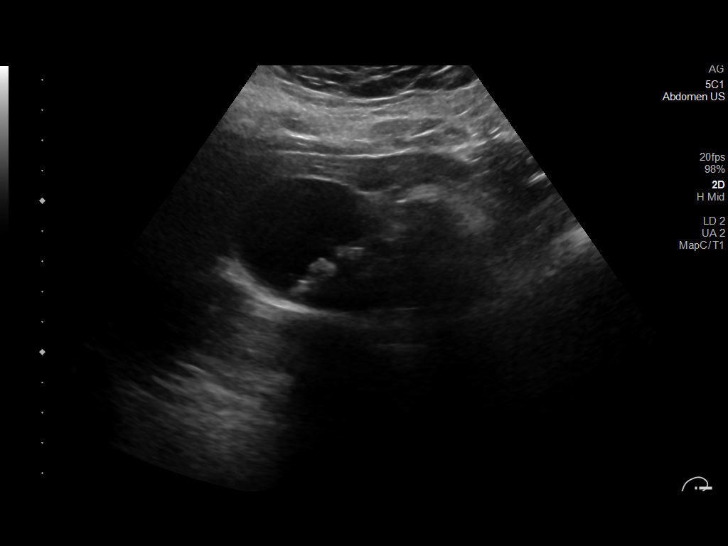
[im 11/29]
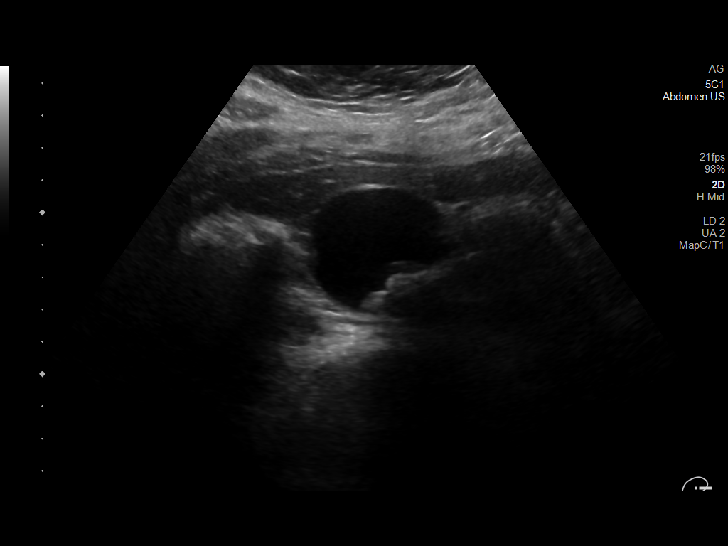
[im 13/29]
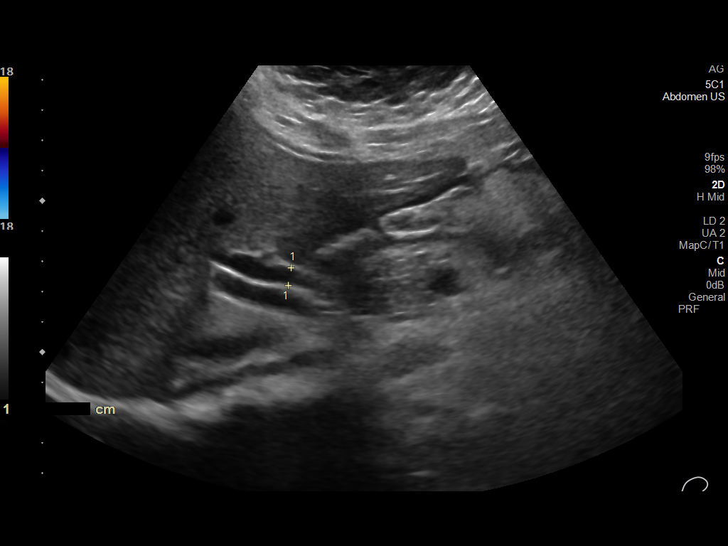
[im 16/29]
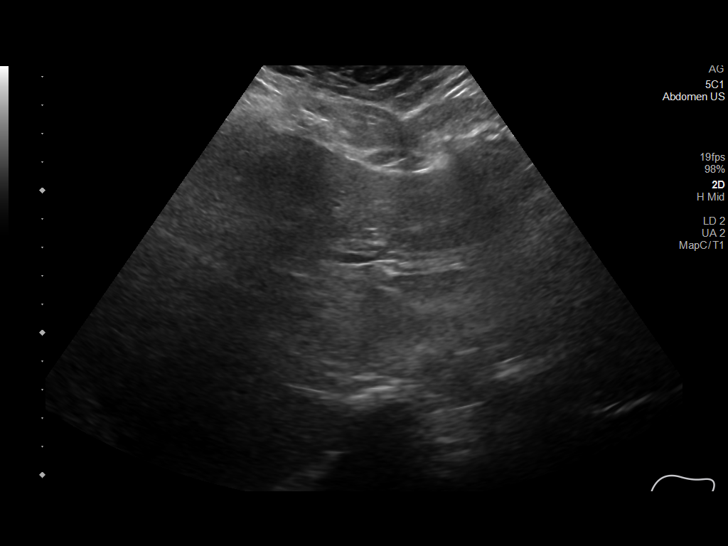
[im 18/29]
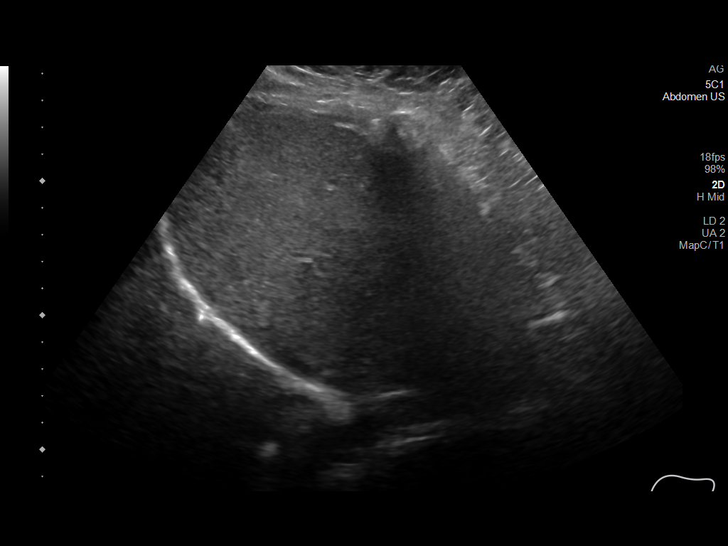
[im 19/29]
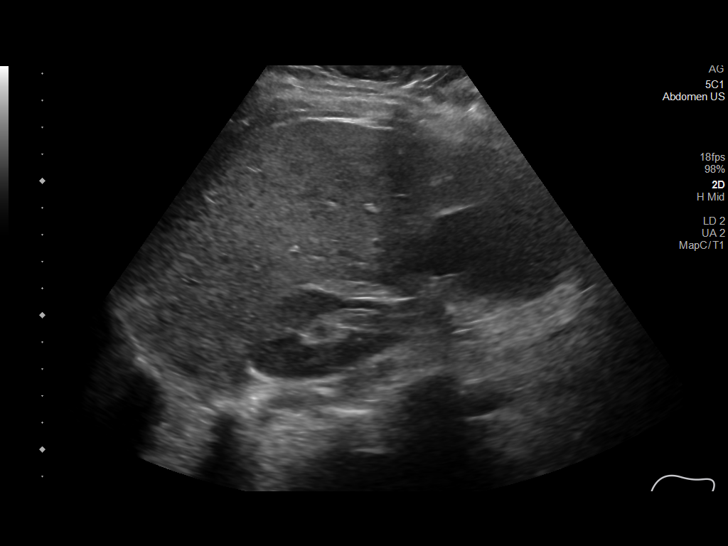
[im 22/29]
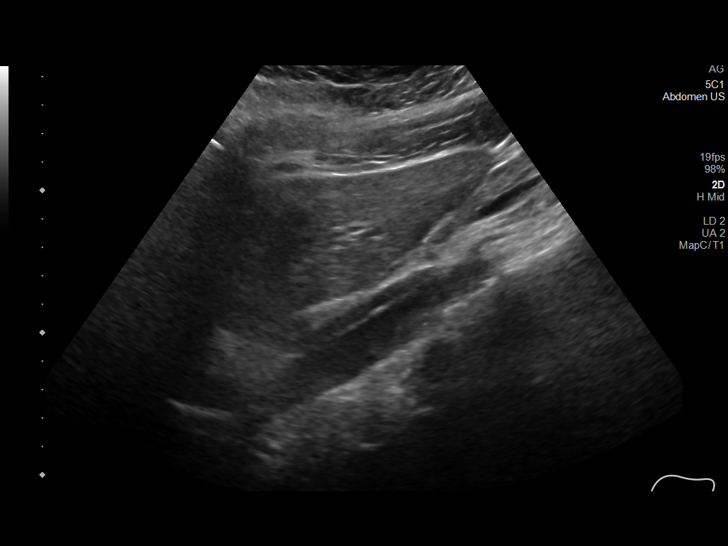
[im 24/29]
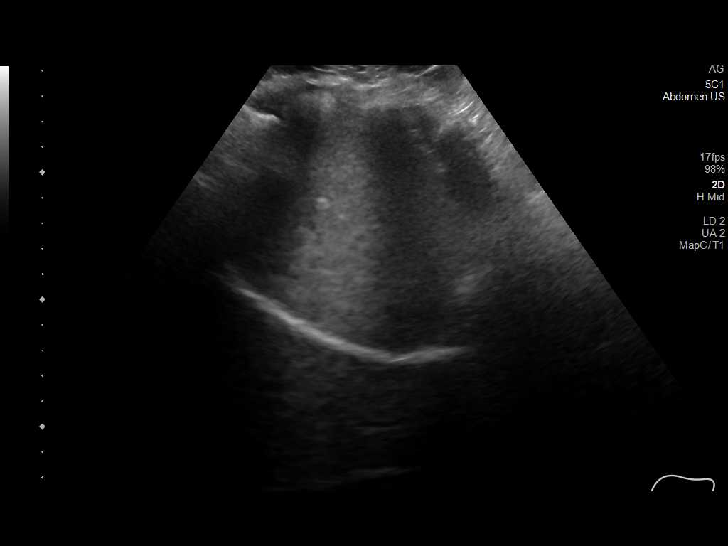
[im 26/29]
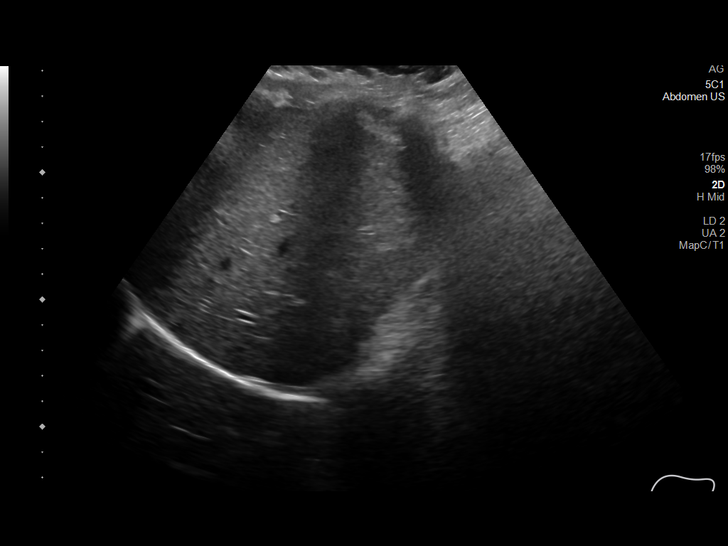
[im 29/29]
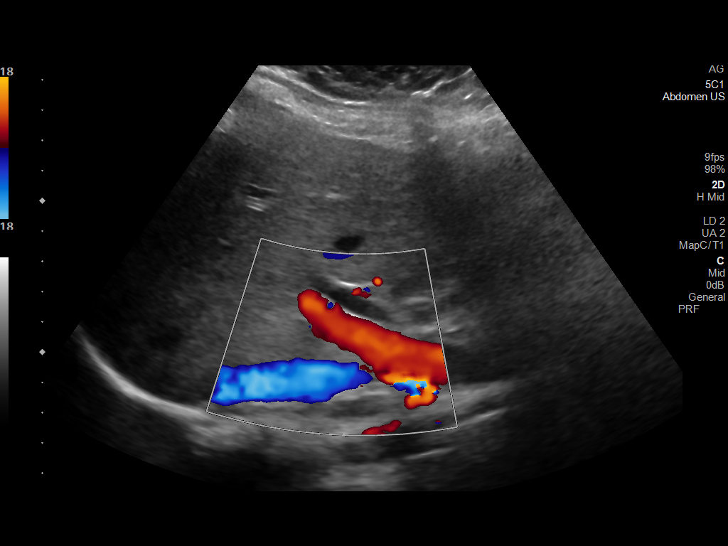

[14 of 25 positions shown; findings below may reference images not displayed]

FINDINGS: Gallbladder:

Numerous layering gallstones measuring up to 8 mm. No gallbladder
wall thickening or focal tenderness.

Common bile duct:

Diameter: 6 mm, upper normal for age. Where visualized, no filling
defect.

Liver:

No focal lesion identified. Within normal limits in parenchymal
echogenicity. Portal vein is patent on color Doppler imaging with
normal direction of blood flow towards the liver.
IMPRESSION: 1. Cholelithiasis without findings of cholecystitis.
2. Upper normal common bile duct diameter, 6 mm, without visible
choledocholithiasis.

## 2021-07-30 ENCOUNTER — Telehealth: Payer: Medicaid Other | Admitting: Emergency Medicine

## 2021-07-30 DIAGNOSIS — J019 Acute sinusitis, unspecified: Secondary | ICD-10-CM | POA: Diagnosis not present

## 2021-07-30 DIAGNOSIS — B9789 Other viral agents as the cause of diseases classified elsewhere: Secondary | ICD-10-CM

## 2021-07-30 MED ORDER — FLUTICASONE PROPIONATE 50 MCG/ACT NA SUSP: 2.0000 | Freq: Every day | NASAL | 0 refills | Status: DC

## 2021-07-30 MED ORDER — AZELASTINE HCL 0.1 % NA SOLN: 2.0000 | Freq: Two times a day (BID) | NASAL | 0 refills | Status: DC

## 2021-07-30 NOTE — Progress Notes (Signed)
E-Visit for Sinus Problems  We are sorry that you are not feeling well.  Here is how we plan to help!  Based on what you have shared with me it looks like you have sinusitis.  Sinusitis is inflammation and infection in the sinus cavities of the head.  Based on your presentation I believe you most likely have Acute Viral Sinusitis.This is an infection most likely caused by a virus. There is not specific treatment for viral sinusitis other than to help you with the symptoms until the infection runs its course.  You may use an oral decongestant such as Mucinex D or if you have glaucoma or high blood pressure use plain Mucinex. Saline nasal spray help and can safely be used as often as needed for congestion, I have prescribed: Fluticasone nasal spray two sprays in each nostril once a day and Azelastine nasal spray 2 sprays in each nostril twice a day  Some authorities believe that zinc sprays or the use of Echinacea may shorten the course of your symptoms.  Sinus infections are not as easily transmitted as other respiratory infection, however we still recommend that you avoid close contact with loved ones, especially the very young and elderly.  Remember to wash your hands thoroughly throughout the day as this is the number one way to prevent the spread of infection!  Home Care: Only take medications as instructed by your medical team. Do not take these medications with alcohol. A steam or ultrasonic humidifier can help congestion.  You can place a towel over your head and breathe in the steam from hot water coming from a faucet. Avoid close contacts especially the very young and the elderly. Cover your mouth when you cough or sneeze. Always remember to wash your hands.  Get Help Right Away If: You develop worsening fever or sinus pain. You develop a severe head ache or visual changes. Your symptoms persist after you have completed your treatment plan.  Make sure you Understand these  instructions. Will watch your condition. Will get help right away if you are not doing well or get worse.   Thank you for choosing an e-visit.  Your e-visit answers were reviewed by a board certified advanced clinical practitioner to complete your personal care plan. Depending upon the condition, your plan could have included both over the counter or prescription medications.  Please review your pharmacy choice. Make sure the pharmacy is open so you can pick up prescription now. If there is a problem, you may contact your provider through [employer] and have the prescription routed to another pharmacy.  Your safety is important to [place]. If you have drug allergies check your prescription carefully.   For the next 24 hours you can use MyChart to ask questions about today's visit, request a non-urgent call back, or ask for a work or school excuse. You will get an email in the next two days asking about your experience. I hope that your e-visit has been valuable and will speed your recovery.  I have spent 5 minutes in review of e-visit questionnaire, review and updating patient chart, medical decision making and response to patient.   Willeen Cass, PhD, FNP-BC

## 2021-08-03 ENCOUNTER — Ambulatory Visit: Payer: Medicaid Other

## 2021-10-22 ENCOUNTER — Ambulatory Visit: Payer: Medicaid Other

## 2021-10-25 ENCOUNTER — Telehealth: Payer: Self-pay

## 2021-10-25 NOTE — Telephone Encounter (Signed)
Called patient to reschedule missed appointment. Left vm to call office.  ?

## 2021-10-26 ENCOUNTER — Ambulatory Visit: Payer: Medicaid Other

## 2021-10-27 ENCOUNTER — Other Ambulatory Visit: Payer: Self-pay

## 2021-10-27 ENCOUNTER — Ambulatory Visit (INDEPENDENT_AMBULATORY_CARE_PROVIDER_SITE_OTHER): Payer: Medicaid Other

## 2021-10-27 ENCOUNTER — Other Ambulatory Visit (HOSPITAL_COMMUNITY)
Admission: RE | Admit: 2021-10-27 | Discharge: 2021-10-27 | Disposition: A | Discharge status: Home / Self Care | Payer: Medicaid Other | Source: Ambulatory Visit | Attending: Family Medicine | Admitting: Family Medicine

## 2021-10-27 VITALS — Ht 64.0 in | Wt 230.0 lb

## 2021-10-27 DIAGNOSIS — Z23 Encounter for immunization: Secondary | ICD-10-CM

## 2021-10-27 DIAGNOSIS — Z113 Encounter for screening for infections with a predominantly sexual mode of transmission: Secondary | ICD-10-CM | POA: Diagnosis present

## 2021-10-27 NOTE — Progress Notes (Signed)
SUBJECTIVE:  ?21 y.o. female who desires a STI screen. ?Denies abnormal vaginal discharge, or significant pelvic pain but does state she has abnormal bleeding. No UTI symptoms. Denies history of known exposure to STD. ? ?No LMP recorded. ?Currently spotting.  ? ?OBJECTIVE:  ?She appears well. ? ? ?ASSESSMENT:  ?STI Screen  ? ?PLAN:  ?Pt offered STI blood screening-not indicated ?GC, chlamydia, and trichomonas probe sent to lab.  ?Treatment: To be determined once lab results are received. ? ?Pt follow up as needed. ? ?

## 2021-10-29 LAB — CERVICOVAGINAL ANCILLARY ONLY
Chlamydia: NEGATIVE
Comment: NEGATIVE
Comment: NEGATIVE
Comment: NORMAL
Neisseria Gonorrhea: NEGATIVE
Trichomonas: NEGATIVE

## 2021-10-29 NOTE — Progress Notes (Signed)
Patient seen and assessed by nursing staff.  Agree with documentation and plan.  

## 2022-02-15 ENCOUNTER — Ambulatory Visit: Payer: Medicaid Other | Admitting: Nurse Practitioner

## 2022-08-02 ENCOUNTER — Ambulatory Visit: Payer: Medicaid Other | Admitting: Nurse Practitioner

## 2023-01-30 ENCOUNTER — Ambulatory Visit (INDEPENDENT_AMBULATORY_CARE_PROVIDER_SITE_OTHER): Payer: Medicaid Other | Admitting: Obstetrics & Gynecology

## 2023-01-30 ENCOUNTER — Other Ambulatory Visit (HOSPITAL_COMMUNITY)
Admission: RE | Admit: 2023-01-30 | Discharge: 2023-01-30 | Disposition: A | Discharge status: Home / Self Care | Payer: Medicaid Other | Source: Ambulatory Visit | Attending: Obstetrics & Gynecology | Admitting: Obstetrics & Gynecology

## 2023-01-30 ENCOUNTER — Encounter: Payer: Self-pay | Admitting: Obstetrics & Gynecology

## 2023-01-30 VITALS — BP 120/78 | HR 81 | Ht 64.5 in | Wt 231.0 lb

## 2023-01-30 DIAGNOSIS — Z862 Personal history of diseases of the blood and blood-forming organs and certain disorders involving the immune mechanism: Secondary | ICD-10-CM

## 2023-01-30 DIAGNOSIS — N923 Ovulation bleeding: Secondary | ICD-10-CM | POA: Diagnosis not present

## 2023-01-30 DIAGNOSIS — N760 Acute vaginitis: Secondary | ICD-10-CM

## 2023-01-30 DIAGNOSIS — D509 Iron deficiency anemia, unspecified: Secondary | ICD-10-CM | POA: Diagnosis not present

## 2023-01-30 DIAGNOSIS — Z113 Encounter for screening for infections with a predominantly sexual mode of transmission: Secondary | ICD-10-CM | POA: Diagnosis not present

## 2023-01-30 DIAGNOSIS — Z01419 Encounter for gynecological examination (general) (routine) without abnormal findings: Secondary | ICD-10-CM | POA: Insufficient documentation

## 2023-01-30 DIAGNOSIS — Z1339 Encounter for screening examination for other mental health and behavioral disorders: Secondary | ICD-10-CM

## 2023-01-30 DIAGNOSIS — B9689 Other specified bacterial agents as the cause of diseases classified elsewhere: Secondary | ICD-10-CM

## 2023-01-30 NOTE — Progress Notes (Signed)
GYNECOLOGY ANNUAL PREVENTATIVE CARE ENCOUNTER NOTE  History:     Jacqueline Peters is a 22 y.o. G0P0000 female here for a routine annual gynecologic exam.  Current complaints: spotting x 3 days followed by a heavy menstrual period. She is at the end of this.  Also worried about vaginal odor. Desires full STI screen and preventative healthcare labs; also reports history of anemia. Denies other abnormal vaginal bleeding, discharge, pelvic pain, problems with intercourse or other gynecologic concerns.    Gynecologic History Patient's last menstrual period was 01/26/2023. Contraception: none Last Pap: 02/02/2021. Result was normal   Obstetric History OB History  Gravida Para Term Preterm AB Living  0 0 0 0 0 0  SAB IAB Ectopic Multiple Live Births  0 0 0 0 0    Past Medical History:  Diagnosis Date   Anemia    Wisdom teeth extracted     History reviewed. No pertinent surgical history.  Current Outpatient Medications on File Prior to Visit  Medication Sig Dispense Refill   [DISCONTINUED] norgestrel-ethinyl estradiol (CRYSELLE-28) 0.3-30 MG-MCG tablet Take 1 tablet by mouth daily. 28 tablet 13   No current facility-administered medications on file prior to visit.    No Known Allergies  Social History:  reports that she has quit smoking. Her smoking use included cigars. She has never used smokeless tobacco. She reports that she does not currently use alcohol. She reports that she does not currently use drugs after having used the following drugs: Marijuana.  History reviewed. No pertinent family history.  The following portions of the patient's history were reviewed and updated as appropriate: allergies, current medications, past family history, past medical history, past social history, past surgical history and problem list.  Review of Systems Pertinent items noted in HPI and remainder of comprehensive ROS otherwise negative.  Physical Exam:  BP 120/78   Pulse 81   Ht 5' 4.5"  (1.638 m)   Wt 231 lb (104.8 kg)   LMP 01/26/2023   BMI 39.04 kg/m  CONSTITUTIONAL: Well-developed, well-nourished female in no acute distress.  HENT:  Normocephalic, atraumatic, External right and left ear normal.  EYES: Conjunctivae and EOM are normal. Pupils are equal, round, and reactive to light. No scleral icterus.  NECK: Normal range of motion, supple, no masses.  Normal thyroid.  SKIN: Skin is warm and dry. No rash noted. Not diaphoretic. No erythema. No pallor. MUSCULOSKELETAL: Normal range of motion. No tenderness.  No cyanosis, clubbing, or edema. NEUROLOGIC: Alert and oriented to person, place, and time. Normal reflexes, muscle tone coordination.  PSYCHIATRIC: Normal mood and affect. Normal behavior. Normal judgment and thought content. CARDIOVASCULAR: Normal heart rate noted, regular rhythm RESPIRATORY: Clear to auscultation bilaterally. Effort and breath sounds normal, no problems with respiration noted. BREASTS: Symmetric in size. No masses, tenderness, skin changes, nipple drainage, or lymphadenopathy bilaterally. Performed in the presence of a chaperone. ABDOMEN: Soft, no distention noted.  No tenderness, rebound or guarding.  PELVIC: Normal appearing external genitalia and urethral meatus; normal appearing vaginal mucosa and cervix.  Bloody discharge noted.  Pap smear and cervicovaginal testing obtained.  Normal uterine size, no other palpable masses, no uterine or adnexal tenderness.  Performed in the presence of a chaperone.   Assessment and Plan:    1. Spotting between menses Likely isolated event, but will follow up labs and manage accordingly. If AUB continues, will need pelvic ultrasound or other evaluation/management.  - Cervicovaginal ancillary only - RPR+HBsAg+HCVAb+HIV - CBC - TSH Rfx on  Abnormal to Free T4  2. History of anemia Labs checked, will follow up results and manage accordingly. - CBC - Ferritin  3. Routine screening for STI (sexually transmitted  infection) STI screen done, will follow up results and manage accordingly. - Cervicovaginal ancillary only - RPR+HBsAg+HCVAb+HIV  4. Well woman exam with routine gynecological exam  - CBC - Cytology - PAP - Ferritin - Comprehensive metabolic panel - Lipid panel - Hemoglobin A1c Will follow up results of pap smear, labs and manage accordingly. Discussed proper vulvar hygiene: discussed avoidance of perfumed soaps, detergents, lotions and any type of douches; in addition to wearing cotton underwear and no underwear at night.  Also recommended cleaning front to back, voiding and cleaning up after intercourse.  Routine preventative health maintenance measures emphasized. Please refer to After Visit Summary for other counseling recommendations.      Jaynie Collins, MD, FACOG Obstetrician & Gynecologist, [facility] for [employer], [facility]

## 2023-01-31 ENCOUNTER — Encounter: Payer: Self-pay | Admitting: Obstetrics & Gynecology

## 2023-01-31 LAB — CBC
Hematocrit: 31.2 % — ABNORMAL LOW (ref 34.0–46.6)
Hemoglobin: 9.4 g/dL — ABNORMAL LOW (ref 11.1–15.9)
MCH: 24.2 pg — ABNORMAL LOW (ref 26.6–33.0)
MCHC: 30.1 g/dL — ABNORMAL LOW (ref 31.5–35.7)
MCV: 80 fL (ref 79–97)
Platelets: 422 10*3/uL (ref 150–450)
RBC: 3.89 x10E6/uL (ref 3.77–5.28)
RDW: 18.2 % — ABNORMAL HIGH (ref 11.7–15.4)
WBC: 5.9 10*3/uL (ref 3.4–10.8)

## 2023-01-31 LAB — COMPREHENSIVE METABOLIC PANEL
ALT: 28 IU/L (ref 0–32)
AST: 20 IU/L (ref 0–40)
Albumin: 4.3 g/dL (ref 4.0–5.0)
Alkaline Phosphatase: 55 IU/L (ref 44–121)
BUN/Creatinine Ratio: 11 (ref 9–23)
BUN: 8 mg/dL (ref 6–20)
Bilirubin Total: 0.3 mg/dL (ref 0.0–1.2)
CO2: 23 mmol/L (ref 20–29)
Calcium: 9.5 mg/dL (ref 8.7–10.2)
Chloride: 104 mmol/L (ref 96–106)
Creatinine, Ser: 0.76 mg/dL (ref 0.57–1.00)
Globulin, Total: 2.9 g/dL (ref 1.5–4.5)
Glucose: 85 mg/dL (ref 70–99)
Potassium: 4 mmol/L (ref 3.5–5.2)
Sodium: 140 mmol/L (ref 134–144)
Total Protein: 7.2 g/dL (ref 6.0–8.5)
eGFR: 113 mL/min/{1.73_m2} (ref >59)

## 2023-01-31 LAB — LIPID PANEL
Chol/HDL Ratio: 4 ratio (ref 0.0–4.4)
Cholesterol, Total: 186 mg/dL (ref 100–199)
HDL: 46 mg/dL (ref >39)
LDL Chol Calc (NIH): 116 mg/dL — ABNORMAL HIGH (ref 0–99)
Triglycerides: 132 mg/dL (ref 0–149)
VLDL Cholesterol Cal: 24 mg/dL (ref 5–40)

## 2023-01-31 LAB — RPR+HBSAG+HCVAB+...
HIV Screen 4th Generation wRfx: NONREACTIVE
Hep C Virus Ab: NONREACTIVE
Hepatitis B Surface Ag: NEGATIVE
RPR Ser Ql: NONREACTIVE

## 2023-01-31 LAB — TSH RFX ON ABNORMAL TO FREE T4: TSH: 1.47 u[IU]/mL (ref 0.450–4.500)

## 2023-01-31 LAB — HEMOGLOBIN A1C
Est. average glucose Bld gHb Est-mCnc: 105 mg/dL
Hgb A1c MFr Bld: 5.3 % (ref 4.8–5.6)

## 2023-01-31 LAB — FERRITIN: Ferritin: 5 ng/mL — ABNORMAL LOW (ref 15–150)

## 2023-01-31 MED ORDER — FERRIC MALTOL 30 MG PO CAPS
1.0000 | ORAL_CAPSULE | Freq: Two times a day (BID) | ORAL | 12 refills | Status: DC
Start: 2023-01-31 — End: 2023-03-14

## 2023-01-31 NOTE — Addendum Note (Signed)
Addended by: Jaynie Collins A on: 01/31/2023 07:54 AM   Modules accepted: Orders

## 2023-02-01 LAB — CERVICOVAGINAL ANCILLARY ONLY
Bacterial Vaginitis (gardnerella): POSITIVE — AB
Candida Glabrata: NEGATIVE
Candida Vaginitis: NEGATIVE
Chlamydia: NEGATIVE
Comment: NEGATIVE
Comment: NEGATIVE
Comment: NEGATIVE
Comment: NEGATIVE
Comment: NEGATIVE
Comment: NORMAL
Neisseria Gonorrhea: NEGATIVE
Trichomonas: NEGATIVE

## 2023-02-01 MED ORDER — METRONIDAZOLE 500 MG PO TABS
500.0000 mg | ORAL_TABLET | Freq: Two times a day (BID) | ORAL | 0 refills | Status: AC
Start: 2023-02-01 — End: 2023-02-08

## 2023-02-01 NOTE — Addendum Note (Signed)
Addended by: Jaynie Collins A on: 02/01/2023 05:05 PM   Modules accepted: Orders

## 2023-02-02 LAB — CYTOLOGY - PAP: Diagnosis: NEGATIVE

## 2023-03-14 ENCOUNTER — Ambulatory Visit: Payer: Medicaid Other | Admitting: Nurse Practitioner

## 2023-03-14 ENCOUNTER — Encounter: Payer: Self-pay | Admitting: Nurse Practitioner

## 2023-03-14 VITALS — BP 100/80 | HR 73 | Temp 97.6°F | Ht 64.0 in | Wt 226.6 lb

## 2023-03-14 DIAGNOSIS — N920 Excessive and frequent menstruation with regular cycle: Secondary | ICD-10-CM | POA: Diagnosis not present

## 2023-03-14 DIAGNOSIS — E669 Obesity, unspecified: Secondary | ICD-10-CM

## 2023-03-14 DIAGNOSIS — D500 Iron deficiency anemia secondary to blood loss (chronic): Secondary | ICD-10-CM | POA: Diagnosis not present

## 2023-03-14 DIAGNOSIS — Z7689 Persons encountering health services in other specified circumstances: Secondary | ICD-10-CM

## 2023-03-14 DIAGNOSIS — H6121 Impacted cerumen, right ear: Secondary | ICD-10-CM | POA: Diagnosis not present

## 2023-03-14 MED ORDER — IRON (FERROUS SULFATE) 325 (65 FE) MG PO TABS
325.0000 mg | ORAL_TABLET | Freq: Every day | ORAL | 2 refills | Status: DC
Start: 2023-03-14 — End: 2023-11-02

## 2023-03-14 NOTE — Assessment & Plan Note (Signed)
Verbal consent obtained.  Patient was prepped per office policy with cerumen softening eardrops and mixture of water and hydrogen peroxide was used ear was irrigated.  Patient tolerated procedure well.  Impaction was removed

## 2023-03-14 NOTE — Patient Instructions (Signed)
Nice to see you today I have sent in iron pills to the pharmacy You need a lab visit in 3 months for [place] to recheck those numbers

## 2023-03-14 NOTE — Assessment & Plan Note (Signed)
Patient recently had labs done with GYN she is working on healthy lifestyle modifications continue current

## 2023-03-14 NOTE — Assessment & Plan Note (Signed)
History of heavy cycles.  She was seen by GYN.  Offered vertigo birth control but patient does not want to do at this juncture

## 2023-03-14 NOTE — Progress Notes (Signed)
New Patient Office Visit  Subjective    Patient ID: Jacqueline Peters, female    DOB: 05/07/2001  Age: 22 y.o. MRN: [id number]  CC:  Chief Complaint  Patient presents with   Establish Care    Pt would like a full STD panel and to discuss her Iron levels.     HPI Jacqueline Peters presents to establish care  IDA: States that she had blood work. She does heavy cycles that are 5-6 days and heavy the first 3 days. Not overly painful. State lot of clots  States she went to pick up the iron and it was expensive   States that she was recently tested and does not have an exposure   for complete physical and follow up of chronic conditions.  Immunizations: -Tetanus: Completed in 2022 -Influenza: Out of season -Shingles: Too young -Pneumonia: Too young - covid: refused -HPV: utd   Diet: Fair diet. States that she eats more out then home.  Exercise: No regular exercise. Started recently, she is going to the gym and doing weights and cardio. 2 days 30 mins   Eye exam: PRN Dental exam: needs updating  Colonoscopy: Too young Lung Cancer Screening: N/A  Pap smear: Done on 01/2023.  This was normal repeat 3 years  Mammogram: Too young, currently average risk   Sleep: states that she go to bed 12 and get up 8-10. Feels rested. Does not snore  Outpatient Encounter Medications as of 03/14/2023  Medication Sig   Iron, Ferrous Sulfate, 325 (65 Fe) MG TABS Take 325 mg by mouth daily.   [DISCONTINUED] Ferric Maltol 30 MG CAPS Take 1 capsule (30 mg total) by mouth 2 (two) times daily. Please take one hour before breakfast and dinner (Patient not taking: Reported on 03/14/2023)   [DISCONTINUED] norgestrel-ethinyl estradiol (CRYSELLE-28) 0.3-30 MG-MCG tablet Take 1 tablet by mouth daily.   No facility-administered encounter medications on file as of 03/14/2023.    Past Medical History:  Diagnosis Date   Iron deficiency anemia    Wisdom teeth extracted     Past Surgical History:  Procedure  Laterality Date   GALLBLADDER SURGERY N/A    pt does not remember date.    No family history on file.  Social History   Socioeconomic History   Marital status: Single    Spouse name: Not on file   Number of children: 0   Years of education: Not on file   Highest education level: Not on file  Occupational History   Not on file  Tobacco Use   Smoking status: Former    Types: Cigars   Smokeless tobacco: Never  Vaping Use   Vaping status: Never Used  Substance and Sexual Activity   Alcohol use: Not Currently    Comment: last use 01/24/20    Drug use: Not Currently    Types: Marijuana    Comment: last MJ 10/2019   Sexual activity: Yes    Birth control/protection: None  Other Topics Concern   Not on file  Social History Narrative   Stay at home          Hobbies: like going outside, walking and mowing    Social Determinants of Health   Financial Resource Strain: Not on file  Food Insecurity: Not on file  Transportation Needs: Not on file  Physical Activity: Not on file  Stress: Not on file  Social Connections: Not on file  Intimate Partner Violence: Not on file    Review of Systems  Constitutional:  Negative for chills and fever.  Respiratory:  Negative for shortness of breath.   Cardiovascular:  Negative for chest pain and leg swelling.  Gastrointestinal:  Negative for abdominal pain, blood in stool, constipation, diarrhea, nausea and vomiting.  Genitourinary:  Negative for dysuria and hematuria.  Neurological:  Negative for tingling and headaches.  Psychiatric/Behavioral:  Negative for hallucinations and suicidal ideas.         Objective    BP 100/80   Pulse 73   Temp 97.6 F (36.4 C) (Temporal)   Ht 5\' 4"  (1.626 m)   Wt 226 lb 9.6 oz (102.8 kg)   SpO2 98%   BMI 38.90 kg/m   Physical Exam Vitals and nursing note reviewed.  Constitutional:      Appearance: Normal appearance.  HENT:     Right Ear: Tympanic membrane, ear canal and external ear  normal.     Left Ear: Tympanic membrane, ear canal and external ear normal.     Mouth/Throat:     Mouth: Mucous membranes are moist.     Pharynx: Oropharynx is clear.  Eyes:     Extraocular Movements: Extraocular movements intact.     Pupils: Pupils are equal, round, and reactive to light.  Cardiovascular:     Rate and Rhythm: Normal rate and regular rhythm.     Pulses: Normal pulses.     Heart sounds: Normal heart sounds.  Pulmonary:     Effort: Pulmonary effort is normal.     Breath sounds: Normal breath sounds.  Musculoskeletal:     Right lower leg: No edema.     Left lower leg: No edema.  Lymphadenopathy:     Cervical: No cervical adenopathy.  Skin:    General: Skin is warm.  Neurological:     General: No focal deficit present.     Mental Status: She is alert.     Deep Tendon Reflexes:     Reflex Scores:      Bicep reflexes are 2+ on the right side and 2+ on the left side.      Patellar reflexes are 2+ on the right side and 2+ on the left side.    Comments: Bilateral upper and lower extremity strength 5/5  Psychiatric:        Mood and Affect: Mood normal.        Behavior: Behavior normal.        Thought Content: Thought content normal.        Judgment: Judgment normal.         Assessment & Plan:   Problem List Items Addressed This Visit       Nervous and Auditory   Impacted cerumen of right ear    Verbal consent obtained.  Patient was prepped per office policy with cerumen softening eardrops and mixture of water and hydrogen peroxide was used ear was irrigated.  Patient tolerated procedure well.  Impaction was removed      Relevant Orders   Ear Lavage     Other   Obesity (BMI 30-39.9)    Patient recently had labs done with GYN she is working on healthy lifestyle modifications continue current      Menorrhagia with regular cycle    History of heavy cycles.  She was seen by GYN.  Offered vertigo birth control but patient does not want to do at this  juncture      Iron deficiency anemia    History of the same.  Patient was written  iron pill but states it was because $600.  Will discontinue that prescription and ferrous sulfate 325 mg 1 tablet daily on empty stomach if tolerated.  Follow-up 3 months for lab recheck      Relevant Medications   Iron, Ferrous Sulfate, 325 (65 Fe) MG TABS   Encounter to establish care with new doctor - Primary    Did review most recent lab results through GYN.       Return in about 1 year (around 03/13/2024) for CPE and Labs.   Audria Nine, NP

## 2023-03-14 NOTE — Assessment & Plan Note (Signed)
Did review most recent lab results through GYN.

## 2023-03-14 NOTE — Assessment & Plan Note (Signed)
History of the same.  Patient was written iron pill but states it was because $600.  Will discontinue that prescription and ferrous sulfate 325 mg 1 tablet daily on empty stomach if tolerated.  Follow-up 3 months for lab recheck

## 2023-04-07 ENCOUNTER — Ambulatory Visit: Payer: Medicaid Other | Admitting: Nurse Practitioner

## 2023-06-14 ENCOUNTER — Ambulatory Visit: Payer: Medicaid Other | Admitting: Nurse Practitioner

## 2023-06-14 ENCOUNTER — Other Ambulatory Visit: Payer: Medicaid Other

## 2023-07-03 ENCOUNTER — Encounter: Payer: Self-pay | Admitting: Nurse Practitioner

## 2023-07-03 ENCOUNTER — Ambulatory Visit (INDEPENDENT_AMBULATORY_CARE_PROVIDER_SITE_OTHER): Payer: Medicaid Other | Admitting: Nurse Practitioner

## 2023-07-03 VITALS — BP 120/78 | HR 85 | Temp 97.8°F | Ht 64.0 in | Wt 229.0 lb

## 2023-07-03 DIAGNOSIS — D509 Iron deficiency anemia, unspecified: Secondary | ICD-10-CM | POA: Diagnosis not present

## 2023-07-03 DIAGNOSIS — R140 Abdominal distension (gaseous): Secondary | ICD-10-CM

## 2023-07-03 DIAGNOSIS — R8290 Unspecified abnormal findings in urine: Secondary | ICD-10-CM | POA: Diagnosis not present

## 2023-07-03 DIAGNOSIS — Z113 Encounter for screening for infections with a predominantly sexual mode of transmission: Secondary | ICD-10-CM | POA: Diagnosis not present

## 2023-07-03 LAB — POCT URINALYSIS DIPSTICK
Bilirubin, UA: NEGATIVE
Blood, UA: POSITIVE
Glucose, UA: NEGATIVE
Ketones, UA: NEGATIVE
Nitrite, UA: NEGATIVE
Protein, UA: POSITIVE — AB
Spec Grav, UA: 1.025 (ref 1.010–1.025)
Urobilinogen, UA: 0.2 U/dL
pH, UA: 6 (ref 5.0–8.0)

## 2023-07-03 LAB — IBC + FERRITIN
Ferritin: 18.2 ng/mL (ref 10.0–291.0)
Iron: 23 ug/dL — ABNORMAL LOW (ref 42–145)
Saturation Ratios: 4.9 % — ABNORMAL LOW (ref 20.0–50.0)
TIBC: 471.8 ug/dL — ABNORMAL HIGH (ref 250.0–450.0)
Transferrin: 337 mg/dL (ref 212.0–360.0)

## 2023-07-03 MED ORDER — OMEPRAZOLE 20 MG PO CPDR
20.0000 mg | DELAYED_RELEASE_CAPSULE | Freq: Every day | ORAL | 3 refills | Status: AC
Start: 2023-07-03 — End: ?

## 2023-07-03 NOTE — Assessment & Plan Note (Signed)
UA with 2+ leukocytes but patient is asymptomatic and exam is benign pending urine culture prior to treating

## 2023-07-03 NOTE — Progress Notes (Signed)
Acute Office Visit  Subjective:     Patient ID: Jacqueline Peters, female    DOB: 06-21-2001, 22 y.o.   MRN: [id number]  Chief Complaint  Patient presents with   Menstrual Problem    Pt complains of being bloated for over week. Pt states of feeling nauseas only after eating. Pt would like to self-swab for STDS.    HPI Patient is in today for abdominal bloating with a history of Thrombocytosis, hematuria, IDA and menorrhagia with regular cycle   Symptoms started approx 1 week. Just after she eats she will feel full. States that it does no hurt. Tried milk of magnesia and did not help. States that she had a BM yesterday and was normal. States that she does feel like she has been more gassy as of late.  Patient states this is happening no matter what she is eating.  She denies any heartburn symptoms.  STD/STI: no symtoms but been having unprotected sex  Review of Systems  Constitutional:  Negative for chills and fever.  Respiratory:  Negative for shortness of breath.   Cardiovascular:  Negative for chest pain.  Gastrointestinal:  Positive for nausea. Negative for abdominal pain, constipation, diarrhea and vomiting.  Genitourinary:  Negative for dysuria and hematuria.        Objective:    BP 120/78   Pulse 85   Temp 97.8 F (36.6 C) (Oral)   Ht 5\' 4"  (1.626 m)   Wt 229 lb (103.9 kg)   LMP 06/16/2023 (Exact Date)   SpO2 95%   BMI 39.31 kg/m  BP Readings from Last 3 Encounters:  07/03/23 120/78  03/14/23 100/80  01/30/23 120/78   Wt Readings from Last 3 Encounters:  07/03/23 229 lb (103.9 kg)  03/14/23 226 lb 9.6 oz (102.8 kg)  01/30/23 231 lb (104.8 kg)   SpO2 Readings from Last 3 Encounters:  07/03/23 95%  03/14/23 98%  06/25/20 100%      Physical Exam Vitals and nursing note reviewed.  Constitutional:      Appearance: Normal appearance.  Cardiovascular:     Rate and Rhythm: Normal rate and regular rhythm.     Heart sounds: Normal heart sounds.  Pulmonary:      Effort: Pulmonary effort is normal.     Breath sounds: Normal breath sounds.  Abdominal:     General: Bowel sounds are normal. There is no distension.     Palpations: There is no mass.     Tenderness: There is no abdominal tenderness. There is no right CVA tenderness or left CVA tenderness.     Hernia: No hernia is present.  Neurological:     Mental Status: She is alert.     Results for orders placed or performed in visit on 07/03/23  POCT urinalysis dipstick  Result Value Ref Range   Color, UA dark yellow    Clarity, UA cloudy    Glucose, UA Negative Negative   Bilirubin, UA neg    Ketones, UA neg    Spec Grav, UA 1.025 1.010 - 1.025   Blood, UA pos    pH, UA 6.0 5.0 - 8.0   Protein, UA Positive (A) Negative   Urobilinogen, UA 0.2 0.2 or 1.0 E.U./dL   Nitrite, UA neg    Leukocytes, UA Moderate (2+) (A) Negative   Appearance     Odor          Assessment & Plan:   Problem List Items Addressed This Visit  Other   Iron deficiency anemia    History of the same.  Patient's been taking her iron supplementation per her report pending iron studies today      Relevant Orders   IBC + Ferritin   Abdominal bloating - Primary    Ambiguous in nature.  Abdominal exam benign in office will treat with omeprazole 20 mg daily for 1 month. UA in office      Relevant Medications   omeprazole (PRILOSEC) 20 MG capsule   Other Relevant Orders   POCT urinalysis dipstick (Completed)   Abnormal urinalysis    UA with 2+ leukocytes but patient is asymptomatic and exam is benign pending urine culture prior to treating      Relevant Orders   Urine Culture   Other Visit Diagnoses     Screening examination for STI       Relevant Orders   C. trachomatis/N. gonorrhoeae RNA   Trichomonas vaginalis, RNA       Meds ordered this encounter  Medications   omeprazole (PRILOSEC) 20 MG capsule    Sig: Take 1 capsule (20 mg total) by mouth daily.    Dispense:  30 capsule     Refill:  3    Order Specific Question:   Supervising Provider    Answer:   TOWER, MARNE A [[id number]    Return if symptoms worsen or fail to improve.  Audria Nine, NP

## 2023-07-03 NOTE — Assessment & Plan Note (Addendum)
Ambiguous in nature.  Abdominal exam benign in office will treat with omeprazole 20 mg daily for 1 month. UA in office

## 2023-07-03 NOTE — Assessment & Plan Note (Signed)
History of the same.  Patient's been taking her iron supplementation per her report pending iron studies today

## 2023-07-03 NOTE — Patient Instructions (Signed)
Nice to see you today I will be in touch with the labs Follow up if you do not improve

## 2023-07-04 LAB — URINE CULTURE
MICRO NUMBER:: 15775690
Result:: NO GROWTH
SPECIMEN QUALITY:: ADEQUATE

## 2023-07-04 LAB — TRICHOMONAS VAGINALIS, PROBE AMP: Trichomonas vaginalis RNA: NOT DETECTED

## 2023-07-04 LAB — C. TRACHOMATIS/N. GONORRHOEAE RNA
C. trachomatis RNA, TMA: NOT DETECTED
N. gonorrhoeae RNA, TMA: NOT DETECTED

## 2023-07-21 ENCOUNTER — Other Ambulatory Visit (HOSPITAL_COMMUNITY)
Admission: RE | Admit: 2023-07-21 | Discharge: 2023-07-21 | Disposition: A | Discharge status: Home / Self Care | Payer: Medicaid Other | Source: Ambulatory Visit | Attending: Nurse Practitioner | Admitting: Nurse Practitioner

## 2023-07-21 ENCOUNTER — Ambulatory Visit (INDEPENDENT_AMBULATORY_CARE_PROVIDER_SITE_OTHER): Payer: Medicaid Other | Admitting: Nurse Practitioner

## 2023-07-21 VITALS — BP 106/80 | HR 78 | Temp 98.0°F | Ht 64.0 in | Wt 229.2 lb

## 2023-07-21 DIAGNOSIS — R140 Abdominal distension (gaseous): Secondary | ICD-10-CM

## 2023-07-21 DIAGNOSIS — N939 Abnormal uterine and vaginal bleeding, unspecified: Secondary | ICD-10-CM | POA: Diagnosis present

## 2023-07-21 DIAGNOSIS — N898 Other specified noninflammatory disorders of vagina: Secondary | ICD-10-CM | POA: Insufficient documentation

## 2023-07-21 LAB — CBC
HCT: 33.9 % — ABNORMAL LOW (ref 36.0–46.0)
Hemoglobin: 10.7 g/dL — ABNORMAL LOW (ref 12.0–15.0)
MCHC: 31.5 g/dL (ref 30.0–36.0)
MCV: 77.6 fL — ABNORMAL LOW (ref 78.0–100.0)
Platelets: 573 10*3/uL — ABNORMAL HIGH (ref 150.0–400.0)
RBC: 4.36 Mil/uL (ref 3.87–5.11)
RDW: 21.7 % — ABNORMAL HIGH (ref 11.5–15.5)
WBC: 6.8 10*3/uL (ref 4.0–10.5)

## 2023-07-21 LAB — POCT URINE PREGNANCY: Preg Test, Ur: NEGATIVE

## 2023-07-21 NOTE — Progress Notes (Signed)
Acute Office Visit  Subjective:     Patient ID: Jacqueline Peters, female    DOB: 2001-06-09, 22 y.o.   MRN: [id number]  Chief Complaint  Patient presents with   Menstrual Problem    Pt complains of bleeding after her period. States she has to wear a pad. Complains of seeing blood cots. On and off bleeding. Pt's cycle ended 12/5.     HPI Patient is in today for abnormal uterine bleeding with a history of thrombocytosis, hematuria, IDA, menorrhagia with regular cycle, obesity.  Patient is followed by OB/GYN Dr. Macon Large.  Last visit was 01/30/2023.  At that visit patient was having spotting between menses.  GYN did labs.  If persistent recommended pelvic ultrasound.  Last Pap smear was done 01/30/2023 that came back negative for intraepithelial for malignancy.  Patient's cycle recently ended on 07/13/2023.  State that she has a regular cycle. States that it is normal 5 days and then ends. States that she is still bleeding since her period stops. States that she is having to wear pad. States that she does have lower abdomen  Review of Systems  Constitutional:  Negative for chills and fever.  Respiratory:  Negative for cough.   Cardiovascular:  Negative for chest pain.  Gastrointestinal:  Positive for abdominal pain. Negative for nausea and vomiting.        Objective:    BP 106/80   Pulse 78   Temp 98 F (36.7 C) (Oral)   Ht 5\' 4"  (1.626 m)   Wt 229 lb 3.2 oz (104 kg)   LMP 06/16/2023 (Exact Date)   SpO2 99%   BMI 39.34 kg/m    Physical Exam Vitals and nursing note reviewed.  Constitutional:      Appearance: Normal appearance.  Cardiovascular:     Rate and Rhythm: Normal rate and regular rhythm.     Heart sounds: Normal heart sounds.  Pulmonary:     Effort: Pulmonary effort is normal.     Breath sounds: Normal breath sounds.  Abdominal:     General: Bowel sounds are normal. There is no distension.     Palpations: There is no mass.     Tenderness: There is no abdominal  tenderness. There is no right CVA tenderness or left CVA tenderness.     Hernia: No hernia is present.  Neurological:     Mental Status: She is alert.     Results for orders placed or performed in visit on 07/21/23  POCT urine pregnancy  Result Value Ref Range   Preg Test, Ur Negative Negative        Assessment & Plan:   Problem List Items Addressed This Visit       Genitourinary   Abnormal uterine bleeding (AUB) - Primary   History of the same.  Did mention this to GYN labs and Pap are normal order placed for pelvic ultrasound.  Pregnancy test negative in office patient given information to call and schedule ultrasound at her convenience      Relevant Orders   POCT urine pregnancy (Completed)   US PELVIC COMPLETE WITH TRANSVAGINAL   Urine cytology ancillary only   CBC     Other   Abdominal bloating   Patient is been using omeprazole without great relief.  She can try over-the-counter probiotics I did recommend align probiotic      Vaginal discharge   Pending wet prep and STI screening.      Relevant Orders   Urine cytology ancillary  only   WET PREP BY MOLECULAR PROBE    No orders of the defined types were placed in this encounter.   Return if symptoms worsen or fail to improve.  Audria Nine, NP

## 2023-07-21 NOTE — Assessment & Plan Note (Signed)
Patient is been using omeprazole without great relief.  She can try over-the-counter probiotics I did recommend align probiotic

## 2023-07-21 NOTE — Patient Instructions (Signed)
Nice to see you today I will be in touch with the labs and ultrasound results Call and schedule the ultrasounds

## 2023-07-21 NOTE — Assessment & Plan Note (Signed)
Pending wet prep and STI screening.

## 2023-07-21 NOTE — Assessment & Plan Note (Signed)
History of the same.  Did mention this to GYN labs and Pap are normal order placed for pelvic ultrasound.  Pregnancy test negative in office patient given information to call and schedule ultrasound at her convenience

## 2023-07-24 LAB — WET PREP BY MOLECULAR PROBE
Candida species: NOT DETECTED
MICRO NUMBER:: 15848509
SPECIMEN QUALITY:: ADEQUATE
Trichomonas vaginosis: NOT DETECTED

## 2023-07-24 LAB — URINE CYTOLOGY ANCILLARY ONLY
Chlamydia: NEGATIVE
Comment: NEGATIVE
Comment: NEGATIVE
Comment: NORMAL
Neisseria Gonorrhea: NEGATIVE
Trichomonas: NEGATIVE

## 2023-07-25 ENCOUNTER — Encounter: Payer: Self-pay | Admitting: Nurse Practitioner

## 2023-07-25 DIAGNOSIS — B9689 Other specified bacterial agents as the cause of diseases classified elsewhere: Secondary | ICD-10-CM

## 2023-07-25 MED ORDER — METRONIDAZOLE 500 MG PO TABS: 500.0000 mg | ORAL_TABLET | Freq: Two times a day (BID) | ORAL | 0 refills | Status: AC

## 2023-07-27 ENCOUNTER — Ambulatory Visit: Payer: Medicaid Other

## 2023-08-01 ENCOUNTER — Ambulatory Visit
Admission: RE | Admit: 2023-08-01 | Discharge: 2023-08-01 | Disposition: A | Discharge status: Home / Self Care | Payer: Medicaid Other | Source: Ambulatory Visit | Attending: Nurse Practitioner | Admitting: Nurse Practitioner

## 2023-08-01 DIAGNOSIS — N939 Abnormal uterine and vaginal bleeding, unspecified: Secondary | ICD-10-CM | POA: Diagnosis present

## 2023-11-01 ENCOUNTER — Ambulatory Visit: Admitting: Nurse Practitioner

## 2023-11-01 ENCOUNTER — Encounter: Payer: Self-pay | Admitting: Obstetrics & Gynecology

## 2023-11-01 ENCOUNTER — Ambulatory Visit: Admitting: Obstetrics & Gynecology

## 2023-11-01 ENCOUNTER — Other Ambulatory Visit (HOSPITAL_COMMUNITY)
Admission: RE | Admit: 2023-11-01 | Discharge: 2023-11-01 | Disposition: A | Discharge status: Home / Self Care | Source: Ambulatory Visit | Attending: Obstetrics & Gynecology | Admitting: Obstetrics & Gynecology

## 2023-11-01 VITALS — BP 115/78 | HR 77 | Wt 222.8 lb

## 2023-11-01 DIAGNOSIS — N760 Acute vaginitis: Secondary | ICD-10-CM | POA: Diagnosis not present

## 2023-11-01 DIAGNOSIS — Z113 Encounter for screening for infections with a predominantly sexual mode of transmission: Secondary | ICD-10-CM | POA: Insufficient documentation

## 2023-11-01 DIAGNOSIS — N926 Irregular menstruation, unspecified: Secondary | ICD-10-CM | POA: Insufficient documentation

## 2023-11-01 DIAGNOSIS — D500 Iron deficiency anemia secondary to blood loss (chronic): Secondary | ICD-10-CM | POA: Diagnosis not present

## 2023-11-01 LAB — CBC
Hematocrit: 30.7 % — ABNORMAL LOW (ref 34.0–46.6)
Hemoglobin: 9.4 g/dL — ABNORMAL LOW (ref 11.1–15.9)
MCH: 23.9 pg — ABNORMAL LOW (ref 26.6–33.0)
MCHC: 30.6 g/dL — ABNORMAL LOW (ref 31.5–35.7)
MCV: 78 fL — ABNORMAL LOW (ref 79–97)
Platelets: 793 10*3/uL — ABNORMAL HIGH (ref 150–450)
RBC: 3.93 x10E6/uL (ref 3.77–5.28)
RDW: 17.1 % — ABNORMAL HIGH (ref 11.7–15.4)
WBC: 6.6 10*3/uL (ref 3.4–10.8)

## 2023-11-01 MED ORDER — DROSPIRENONE-ETHINYL ESTRADIOL 3-0.02 MG PO TABS: 1.0000 | ORAL_TABLET | Freq: Every day | ORAL | 11 refills | Status: AC

## 2023-11-01 NOTE — Progress Notes (Signed)
 GYNECOLOGY OFFICE VISIT NOTE  History:   Jacqueline Peters is a 23 y.o. G0P0000 here today for evaluation of vulva and vagina swelling for a few days, but reports it resolved today. Associated with itching.  [name], she has prolonged menstrual periods and has bleeding for a one week after her usual six day duration. This has happened for last 2-3 months.  No pelvic pain, just bleeding for almost two weeks every month.  Bleeding varies in intensity.  Patient also desires STI screening. She denies any abnormal vaginal discharge, current pelvic pain or other concerns.    Past Medical History:  Diagnosis Date   Iron deficiency anemia    Wisdom teeth extracted     Past Surgical History:  Procedure Laterality Date   GALLBLADDER SURGERY N/A    pt does not remember date.    The following portions of the patient's history were reviewed and updated as appropriate: allergies, current medications, past family history, past medical history, past social history, past surgical history and problem list.   Health Maintenance:  Normal pap on 01/30/2023.   Review of Systems:  Pertinent items noted in HPI and remainder of comprehensive ROS otherwise negative.  Physical Exam:  BP 115/78   Pulse 77   Wt 222 lb 12.8 oz (101.1 kg)   LMP 10/28/2023 (Approximate)   BMI 38.24 kg/m  CONSTITUTIONAL: Well-developed, well-nourished female in no acute distress.  HEENT:  Normocephalic, atraumatic. External right and left ear normal. No scleral icterus.  NECK: Normal range of motion, supple, no masses noted on observation SKIN: No rash noted. Not diaphoretic. No erythema. No pallor. MUSCULOSKELETAL: Normal range of motion. No edema noted. NEUROLOGIC: Alert and oriented to person, place, and time. Normal muscle tone coordination. No cranial nerve deficit noted. PSYCHIATRIC: Normal mood and affect. Normal behavior. Normal judgment and thought content. CARDIOVASCULAR: Normal heart rate  noted RESPIRATORY: Effort and breath sounds normal, no problems with respiration noted ABDOMEN: No masses noted. No other overt distention noted.   PELVIC: Normal appearing external genitalia with no edema; normal urethral meatus; normal appearing vaginal mucosa and cervix. Bloody discharge noted, testing sample obtained.  Normal uterine size, no other palpable masses, no uterine or adnexal tenderness. Performed in the presence of a chaperone     Assessment and Plan:     1. Routine screening for STI (sexually transmitted infection) STI screening done, will follow up results and manage accordingly. - Cervicovaginal ancillary only - RPR+HBsAg+HCVAb+HIV  2. Vulvovaginitis Testing done, will follow up results and manage accordingly. Proper vulvar hygiene emphasized: discussed avoidance of perfumed soaps, detergents, lotions and any type of douches; in addition to wearing cotton underwear and no underwear at night.  Also recommended cleaning front to back, voiding and cleaning up after intercourse.  - Cervicovaginal ancillary only  3. Irregular menses (Primary) Prolonged menstrual periods for the past few months.  Labs and ultrasound ordered for evaluation, will follow up results and manage accordingly.  Discussed management with OCPs, this will also provide contraception, patient agreed to this. Risks and benefits of OCPs discussed, all questions discussed, information given to her.  Yaz prescribed, will come back for follow up in about 6 weeks.  - Cervicovaginal ancillary only - Beta hCG quant (ref lab) - Hemoglobin A1c - TSH Rfx on Abnormal to Free T4 - Testosterone,Free and Total - Anti mullerian hormone - US PELVIC COMPLETE WITH TRANSVAGINAL; Future - CBC - drospirenone-ethinyl estradiol (YAZ) 3-0.02 MG tablet; Take 1  tablet by mouth daily.  Dispense: 28 tablet; Refill: 11  Routine preventative health maintenance measures emphasized. Please refer to After Visit Summary for other  counseling recommendations.   Return in about 6 weeks (around 12/13/2023) for OCP/BP check, follow up irregular menses.    I spent 30 minutes dedicated to the care of this patient including pre-visit review of records, face to face time with the patient discussing her conditions and treatments, post visit ordering of medications and appropriate tests or procedures, coordinating care and documenting this visit encounter.    Jaynie Collins, MD, FACOG Obstetrician & Gynecologist, [facility] for [employer], [facility]

## 2023-11-02 ENCOUNTER — Encounter: Payer: Self-pay | Admitting: Obstetrics & Gynecology

## 2023-11-02 LAB — CERVICOVAGINAL ANCILLARY ONLY
Bacterial Vaginitis (gardnerella): POSITIVE — AB
Candida Glabrata: NEGATIVE
Candida Vaginitis: POSITIVE — AB
Chlamydia: NEGATIVE
Comment: NEGATIVE
Comment: NEGATIVE
Comment: NEGATIVE
Comment: NEGATIVE
Comment: NEGATIVE
Comment: NORMAL
Neisseria Gonorrhea: NEGATIVE
Trichomonas: NEGATIVE

## 2023-11-02 LAB — TSH RFX ON ABNORMAL TO FREE T4: TSH: 1.06 u[IU]/mL (ref 0.450–4.500)

## 2023-11-02 MED ORDER — FERRIC MALTOL 30 MG PO CAPS: 1.0000 | ORAL_CAPSULE | Freq: Two times a day (BID) | ORAL | 2 refills | Status: AC

## 2023-11-02 NOTE — Addendum Note (Signed)
 Addended by: Jaynie Collins A on: 11/02/2023 07:54 AM   Modules accepted: Orders

## 2023-11-07 ENCOUNTER — Encounter: Payer: Self-pay | Admitting: Obstetrics & Gynecology

## 2023-11-07 MED ORDER — FLUCONAZOLE 150 MG PO TABS: 150.0000 mg | ORAL_TABLET | Freq: Once | ORAL | 3 refills | Status: AC

## 2023-11-07 MED ORDER — METRONIDAZOLE 500 MG PO TABS: 500.0000 mg | ORAL_TABLET | Freq: Two times a day (BID) | ORAL | 0 refills | Status: AC

## 2023-11-07 NOTE — Addendum Note (Signed)
 Addended by: Jaynie Collins A on: 11/07/2023 11:16 AM   Modules accepted: Orders

## 2023-11-08 ENCOUNTER — Ambulatory Visit: Attending: Obstetrics & Gynecology

## 2023-11-13 ENCOUNTER — Encounter: Payer: Self-pay | Admitting: Obstetrics & Gynecology

## 2023-11-13 LAB — HEMOGLOBIN A1C
Est. average glucose Bld gHb Est-mCnc: 108 mg/dL
Hgb A1c MFr Bld: 5.4 % (ref 4.8–5.6)

## 2023-11-13 LAB — RPR+HBSAG+HCVAB+...
HIV Screen 4th Generation wRfx: NONREACTIVE
Hep C Virus Ab: NONREACTIVE
Hepatitis B Surface Ag: NEGATIVE
RPR Ser Ql: NONREACTIVE

## 2023-11-13 LAB — BETA HCG QUANT (REF LAB): hCG Quant: <1 m[IU]/mL

## 2023-11-13 LAB — TESTOSTERONE,FREE AND TOTAL
Testosterone, Free: 5.6 pg/mL — ABNORMAL HIGH (ref 0.0–4.2)
Testosterone: 46 ng/dL (ref 13–71)

## 2023-11-13 LAB — ANTI MULLERIAN HORMONE: ANTI-MULLERIAN HORMONE (AMH): 4.23 ng/mL

## 2023-11-14 ENCOUNTER — Ambulatory Visit: Admitting: Nurse Practitioner

## 2023-12-04 ENCOUNTER — Other Ambulatory Visit: Payer: Self-pay | Admitting: *Deleted

## 2023-12-04 MED ORDER — METRONIDAZOLE 500 MG PO TABS: 500.0000 mg | ORAL_TABLET | Freq: Two times a day (BID) | ORAL | 0 refills | Status: AC

## 2023-12-04 MED ORDER — FLUCONAZOLE 150 MG PO TABS: 150.0000 mg | ORAL_TABLET | Freq: Once | ORAL | 3 refills | Status: AC

## 2023-12-14 ENCOUNTER — Ambulatory Visit: Admitting: Obstetrics & Gynecology

## 2024-01-11 ENCOUNTER — Other Ambulatory Visit: Payer: Self-pay | Admitting: *Deleted

## 2024-01-11 ENCOUNTER — Encounter: Payer: Self-pay | Admitting: Obstetrics & Gynecology

## 2024-01-11 MED ORDER — FLUCONAZOLE 150 MG PO TABS: 150.0000 mg | ORAL_TABLET | Freq: Once | ORAL | 1 refills | Status: AC

## 2024-01-11 MED ORDER — METRONIDAZOLE 500 MG PO TABS: 500.0000 mg | ORAL_TABLET | Freq: Two times a day (BID) | ORAL | 0 refills | Status: AC

## 2024-03-06 ENCOUNTER — Ambulatory Visit: Admitting: Nurse Practitioner

## 2024-03-06 NOTE — Progress Notes (Deleted)
   Acute Office Visit  Subjective:     Patient ID: Jacqueline Peters, female    DOB: 11-Aug-2000, 23 y.o.   MRN: [id number]  No chief complaint on file.   HPI Patient is in today for ***with a history of AUB, thrombocytosis, IDA  ROS      Objective:    There were no vitals taken for this visit. {Vitals History (Optional):23777}  Physical Exam  No results found for any visits on 03/06/24.      Assessment & Plan:   Problem List Items Addressed This Visit   None   No orders of the defined types were placed in this encounter.   No follow-ups on file.  Adina Crandall, NP

## 2024-03-12 ENCOUNTER — Ambulatory Visit: Admitting: Nurse Practitioner

## 2024-03-13 ENCOUNTER — Other Ambulatory Visit: Payer: Medicaid Other

## 2024-04-18 ENCOUNTER — Ambulatory Visit: Admitting: Obstetrics & Gynecology

## 2024-04-18 ENCOUNTER — Other Ambulatory Visit (HOSPITAL_COMMUNITY)
Admission: RE | Admit: 2024-04-18 | Discharge: 2024-04-18 | Disposition: A | Discharge status: Home / Self Care | Source: Ambulatory Visit | Attending: Obstetrics & Gynecology | Admitting: Obstetrics & Gynecology

## 2024-04-18 VITALS — BP 130/84 | HR 79 | Ht 62.5 in | Wt 228.8 lb

## 2024-04-18 DIAGNOSIS — B3731 Acute candidiasis of vulva and vagina: Secondary | ICD-10-CM

## 2024-04-18 DIAGNOSIS — N760 Acute vaginitis: Secondary | ICD-10-CM | POA: Insufficient documentation

## 2024-04-18 MED ORDER — NYSTATIN 100000 UNIT/GM EX CREA: 1.0000 | TOPICAL_CREAM | Freq: Two times a day (BID) | CUTANEOUS | 3 refills | Status: DC

## 2024-04-18 MED ORDER — FLUCONAZOLE 150 MG PO TABS: 150.0000 mg | ORAL_TABLET | ORAL | 3 refills | Status: AC

## 2024-04-18 MED ORDER — NYSTATIN 100000 UNIT/GM EX CREA: 1.0000 | TOPICAL_CREAM | Freq: Two times a day (BID) | CUTANEOUS | 0 refills | Status: DC

## 2024-04-18 NOTE — Progress Notes (Signed)
   GYNECOLOGY OFFICE VISIT NOTE  History:  Jacqueline Peters is a 23 y.o. G0P0000 here today for evaluation of vaginal itching and white discharge. Also healing boil on mons pubis.  She denies any abnormal vaginal discharge, bleeding, pelvic pain or other concerns.  Past Medical History:  Diagnosis Date   Iron  deficiency anemia    Wisdom teeth extracted     Past Surgical History:  Procedure Laterality Date   GALLBLADDER SURGERY N/A    pt does not remember date.    The following portions of the patient's history were reviewed and updated as appropriate: allergies, current medications, past family history, past medical history, past social history, past surgical history and problem list.   Health Maintenance:  Normal pap and negative HRHPV on 01/30/2023.    Review of Systems:  Pertinent items noted in HPI and remainder of comprehensive ROS otherwise negative.  Physical Exam:  BP 130/84   Pulse 79   Ht 5' 2.5 (1.588 m)   Wt 228 lb 12.8 oz (103.8 kg)   LMP 03/30/2024 (Exact Date)   BMI 41.18 kg/m  CONSTITUTIONAL: Well-developed, well-nourished female in no acute distress.  HEENT:  Normocephalic, atraumatic. External right and left ear normal. No scleral icterus.  NECK: Normal range of motion, supple, no masses noted on observation SKIN: No rash noted. Not diaphoretic. No erythema. No pallor. MUSCULOSKELETAL: Normal range of motion. No edema noted. NEUROLOGIC: Alert and oriented to person, place, and time. Normal muscle tone coordination. No cranial nerve deficit noted on observation. PSYCHIATRIC: Normal mood and affect. Normal behavior. Normal judgment and thought content. CARDIOVASCULAR: Normal heart rate noted RESPIRATORY: Effort and breath sounds normal, no problems with respiration noted ABDOMEN: No masses or other overt distention noted on observation. No tenderness.   PELVIC: Healing folliculitis lesion on left upper area of mons pubis, no erythema or drainage.  Diffuse  erythema on her vulva and bilateral inner thighs with excoriation.  Normal architecture of external genitalia; normal urethral meatus. White discharge noted, testing sample obtained. Performed in the presence of a chaperone   Assessment and Plan:     1. Acute vaginitis (Primary) 2. Yeast infection involving the vagina and surrounding area Likely yeast infection, will treat with topical and oral antifungals, will monitor response. Will also follow up test results and manage accordingly. Proper vulvar hygiene emphasized: discussed avoidance of perfumed soaps, detergents, lotions and any type of douches; in addition to wearing cotton underwear and no underwear at night.  Also recommended cleaning front to back, voiding and cleaning up after intercourse.  - Cervicovaginal ancillary only( [name]) - nystatin  cream (MYCOSTATIN ); Apply 1 Application topically 2 (two) times daily for seven days.  Dispense: 30 g; Refill: 3 - fluconazole  (DIFLUCAN ) 150 MG tablet; Take 1 tablet (150 mg total) by mouth every 3 (three) days. For three doses  Dispense: 3 tablet; Refill: 3   Routine preventative health maintenance measures emphasized. Please refer to After Visit Summary for other counseling recommendations.   Return for any gynecologic concerns.    I spent 25 minutes dedicated to the care of this patient including pre-visit review of records, face to face time with the patient discussing her conditions and treatments, post visit ordering of medications and appropriate tests or procedures, coordinating care and documenting this visit encounter.    GLORIS HUGGER, MD, FACOG Obstetrician & Gynecologist, [facility] for [employer], [facility]

## 2024-04-22 ENCOUNTER — Ambulatory Visit: Payer: Self-pay | Admitting: Obstetrics & Gynecology

## 2024-04-22 DIAGNOSIS — N760 Acute vaginitis: Secondary | ICD-10-CM

## 2024-04-22 LAB — CERVICOVAGINAL ANCILLARY ONLY
Bacterial Vaginitis (gardnerella): POSITIVE — AB
Candida Glabrata: NEGATIVE
Candida Vaginitis: POSITIVE — AB
Comment: NEGATIVE
Comment: NEGATIVE
Comment: NEGATIVE
Comment: NEGATIVE
Trichomonas: NEGATIVE

## 2024-04-22 MED ORDER — METRONIDAZOLE 500 MG PO TABS: 500.0000 mg | ORAL_TABLET | Freq: Two times a day (BID) | ORAL | 0 refills | Status: AC

## 2024-05-13 ENCOUNTER — Other Ambulatory Visit: Payer: Self-pay | Admitting: *Deleted

## 2024-05-13 DIAGNOSIS — B3731 Acute candidiasis of vulva and vagina: Secondary | ICD-10-CM

## 2024-05-13 MED ORDER — NYSTATIN 100000 UNIT/GM EX CREA: 1.0000 | TOPICAL_CREAM | Freq: Two times a day (BID) | CUTANEOUS | 1 refills | Status: AC

## 2024-07-29 ENCOUNTER — Other Ambulatory Visit: Payer: Self-pay | Admitting: Obstetrics & Gynecology

## 2024-07-29 DIAGNOSIS — N760 Acute vaginitis: Secondary | ICD-10-CM
